# Patient Record
Sex: Female | Born: 1965
Health system: Southern US, Community
[De-identification: ages and names within clinical notes are randomized; demographics above are authoritative.]

## PROBLEM LIST (undated history)

## (undated) DIAGNOSIS — F32A Depression, unspecified: Secondary | ICD-10-CM

## (undated) DIAGNOSIS — E042 Nontoxic multinodular goiter: Secondary | ICD-10-CM

## (undated) DIAGNOSIS — H44811 Hemophthalmos, right eye: Secondary | ICD-10-CM

## (undated) DIAGNOSIS — I10 Essential (primary) hypertension: Secondary | ICD-10-CM

## (undated) DIAGNOSIS — F329 Major depressive disorder, single episode, unspecified: Secondary | ICD-10-CM

## (undated) DIAGNOSIS — F419 Anxiety disorder, unspecified: Secondary | ICD-10-CM

## (undated) DIAGNOSIS — E079 Disorder of thyroid, unspecified: Secondary | ICD-10-CM

## (undated) DIAGNOSIS — S42309A Unspecified fracture of shaft of humerus, unspecified arm, initial encounter for closed fracture: Secondary | ICD-10-CM

## (undated) DIAGNOSIS — M26609 Unspecified temporomandibular joint disorder, unspecified side: Secondary | ICD-10-CM

## (undated) DIAGNOSIS — F909 Attention-deficit hyperactivity disorder, unspecified type: Secondary | ICD-10-CM

## (undated) DIAGNOSIS — F988 Other specified behavioral and emotional disorders with onset usually occurring in childhood and adolescence: Secondary | ICD-10-CM

## (undated) DIAGNOSIS — K219 Gastro-esophageal reflux disease without esophagitis: Secondary | ICD-10-CM

## (undated) DIAGNOSIS — F341 Dysthymic disorder: Secondary | ICD-10-CM

## (undated) DIAGNOSIS — M797 Fibromyalgia: Secondary | ICD-10-CM

## (undated) HISTORY — DX: Disorder of thyroid, unspecified: E07.9

## (undated) HISTORY — DX: Anxiety disorder, unspecified: F41.9

## (undated) HISTORY — PX: LAPAROSCOPIC OVARIAN CYSTECTOMY: SUR786

## (undated) HISTORY — DX: Other specified behavioral and emotional disorders with onset usually occurring in childhood and adolescence: F98.8

## (undated) HISTORY — DX: Unspecified fracture of shaft of humerus, unspecified arm, initial encounter for closed fracture: S42.309A

## (undated) HISTORY — DX: Fibromyalgia: M79.7

## (undated) HISTORY — DX: Dysthymic disorder: F34.1

## (undated) HISTORY — DX: Gastro-esophageal reflux disease without esophagitis: K21.9

## (undated) HISTORY — DX: Essential (primary) hypertension: I10

## (undated) HISTORY — DX: Hemophthalmos, right eye: H44.811

## (undated) HISTORY — DX: Attention-deficit hyperactivity disorder, unspecified type: F90.9

## (undated) HISTORY — DX: Nontoxic multinodular goiter: E04.2

## (undated) HISTORY — PX: APPENDECTOMY: SHX54

## (undated) HISTORY — DX: Depression, unspecified: F32.A

## (undated) HISTORY — DX: Unspecified temporomandibular joint disorder, unspecified side: M26.609

---
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
## 2001-07-26 ENCOUNTER — Other Ambulatory Visit: Admission: RE | Admit: 2001-07-26 | Discharge: 2001-07-26 | Payer: Self-pay | Admitting: *Deleted

## 2006-03-20 ENCOUNTER — Encounter: Admission: RE | Admit: 2006-03-20 | Discharge: 2006-03-20 | Payer: Self-pay | Admitting: Family Medicine

## 2006-04-18 ENCOUNTER — Encounter: Admission: RE | Admit: 2006-04-18 | Discharge: 2006-04-18 | Payer: Self-pay | Admitting: Family Medicine

## 2006-09-01 ENCOUNTER — Ambulatory Visit (HOSPITAL_COMMUNITY): Admission: RE | Admit: 2006-09-01 | Discharge: 2006-09-01 | Payer: Self-pay | Admitting: Family Medicine

## 2007-03-22 ENCOUNTER — Encounter: Admission: RE | Admit: 2007-03-22 | Discharge: 2007-03-22 | Payer: Self-pay | Admitting: Family Medicine

## 2007-03-29 ENCOUNTER — Encounter: Admission: RE | Admit: 2007-03-29 | Discharge: 2007-03-29 | Payer: Self-pay | Admitting: Family Medicine

## 2008-05-15 ENCOUNTER — Encounter: Admission: RE | Admit: 2008-05-15 | Discharge: 2008-05-15 | Payer: Self-pay | Admitting: Family Medicine

## 2009-12-02 ENCOUNTER — Encounter: Admission: RE | Admit: 2009-12-02 | Discharge: 2009-12-02 | Payer: Self-pay | Admitting: Family Medicine

## 2010-07-20 ENCOUNTER — Encounter: Admission: RE | Admit: 2010-07-20 | Discharge: 2010-07-20 | Payer: Self-pay | Admitting: Family Medicine

## 2010-10-17 ENCOUNTER — Encounter: Payer: Self-pay | Admitting: Family Medicine

## 2010-12-18 IMAGING — US US SOFT TISSUE HEAD/NECK
1 series · 14 of 25 positions shown · non-contrast
Comparison: None

CLINICAL DATA: Neck swelling

THYROID ULTRASOUND
TECHNIQUE: Ultrasound examination of the thyroid gland and adjacent
soft tissues was performed.

[Series 1: us soft tissue head/neck · 0.04mm/px · 14 of 47 slices shown]
[im 1/47]
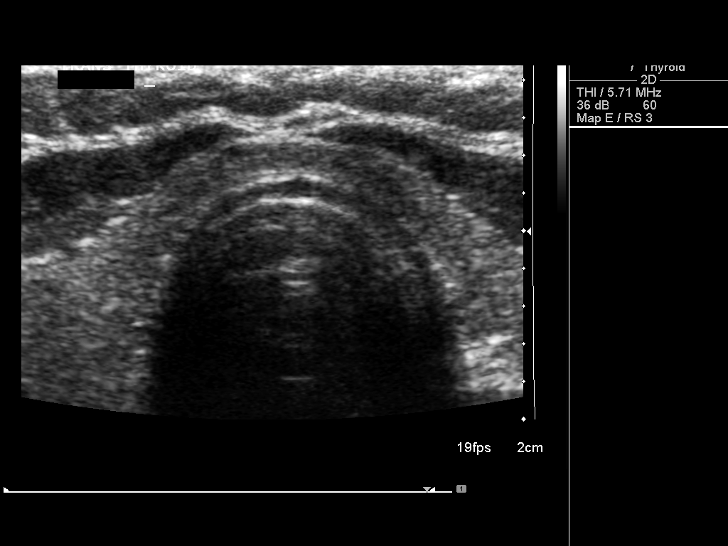
[im 4/47]
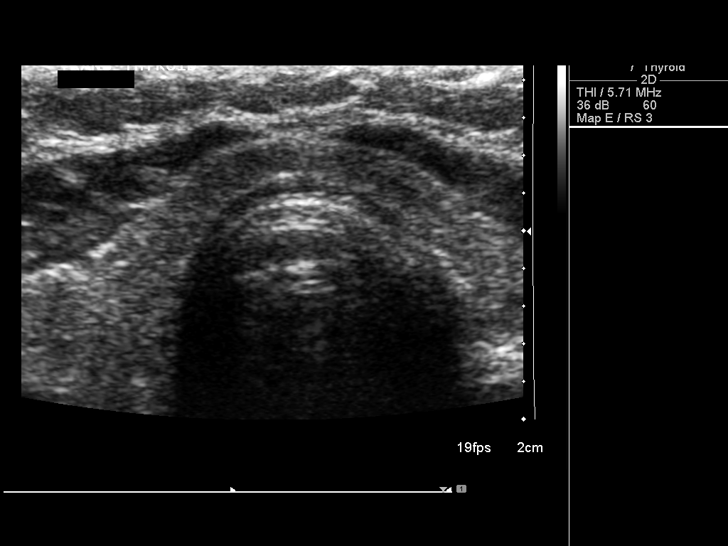
[im 8/47]
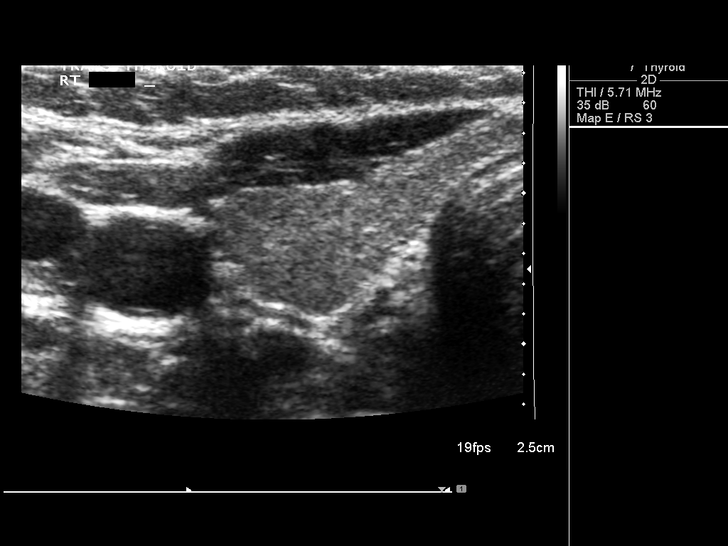
[im 12/47]
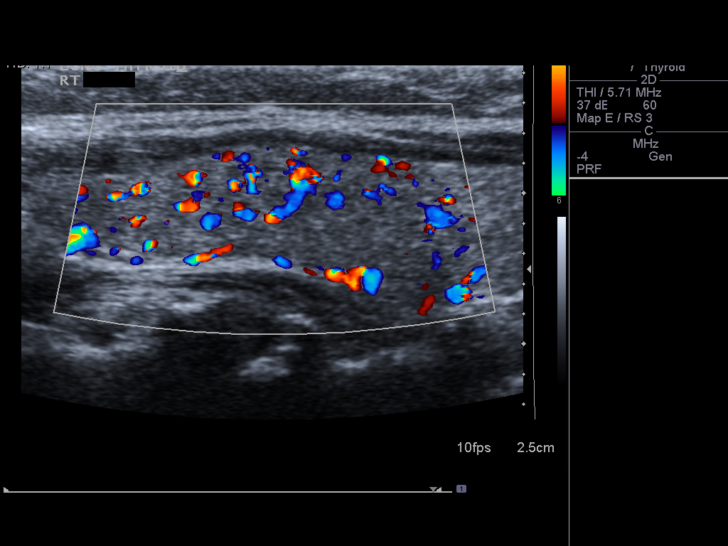
[im 16/47]
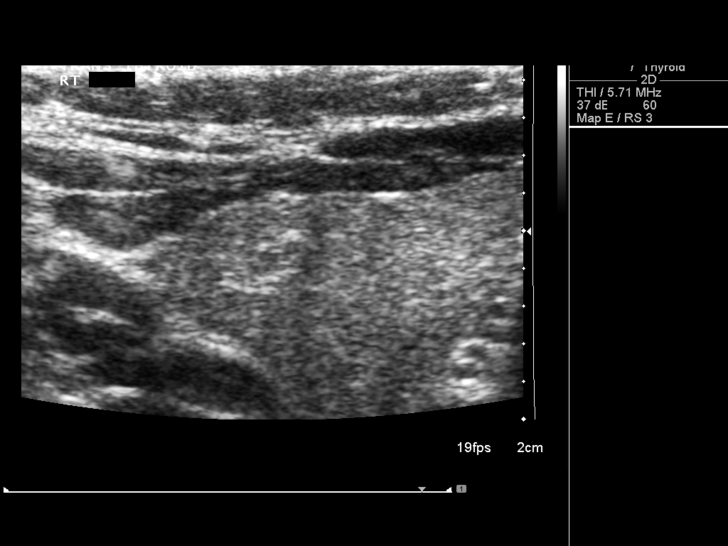
[im 18/47]
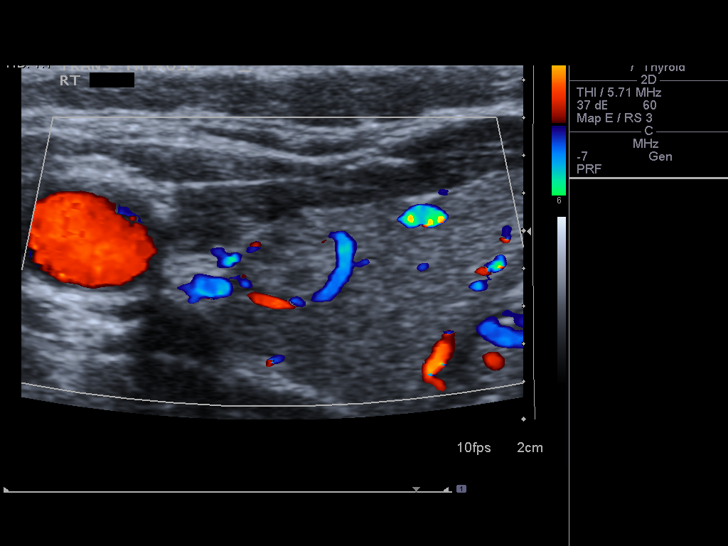
[im 22/47]
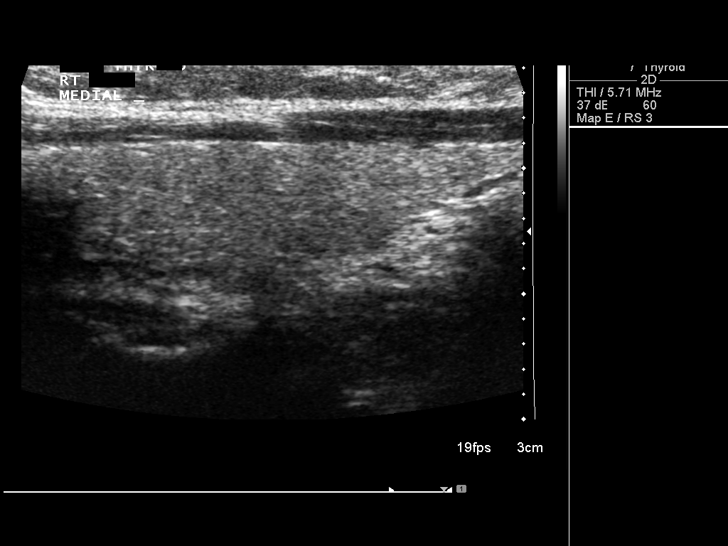
[im 25/47]
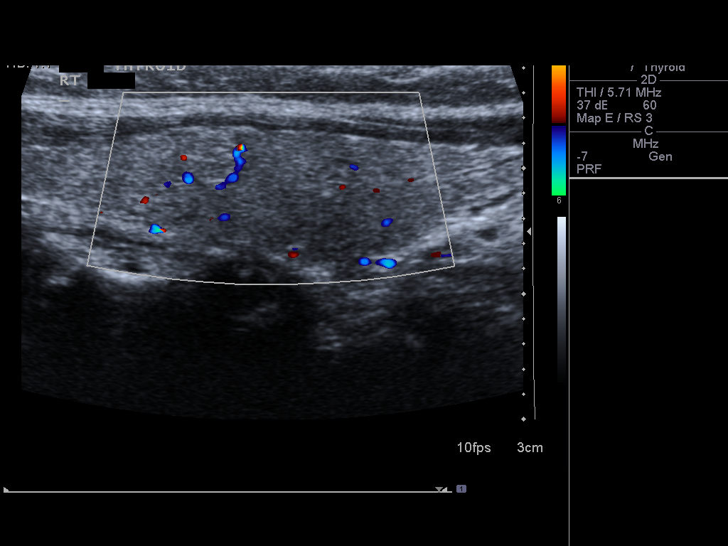
[im 29/47]
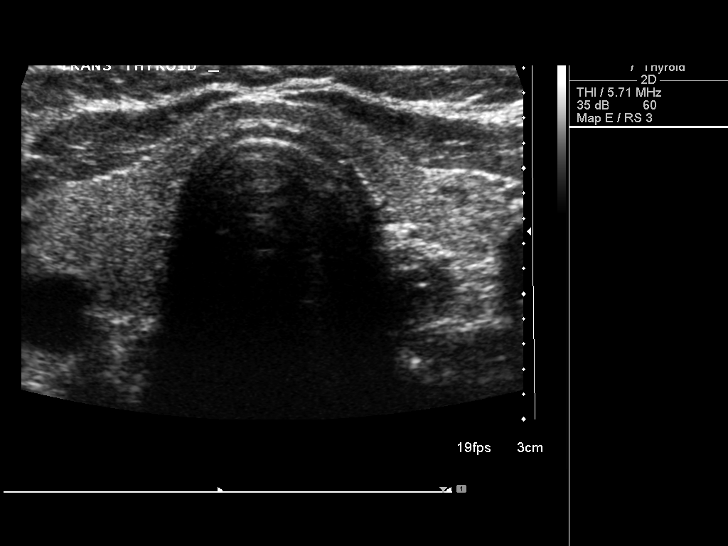
[im 31/47]
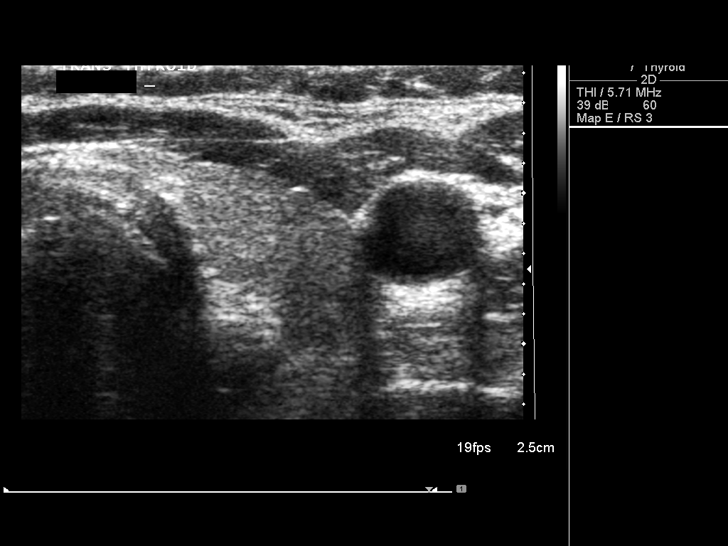
[im 35/47]
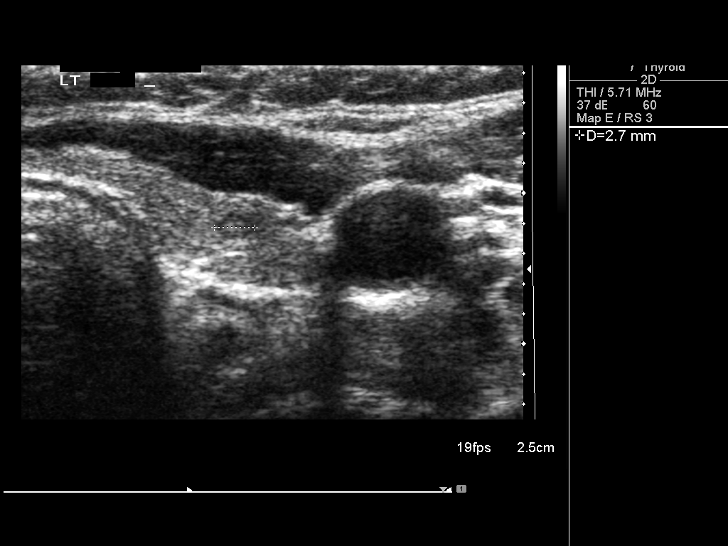
[im 39/47]
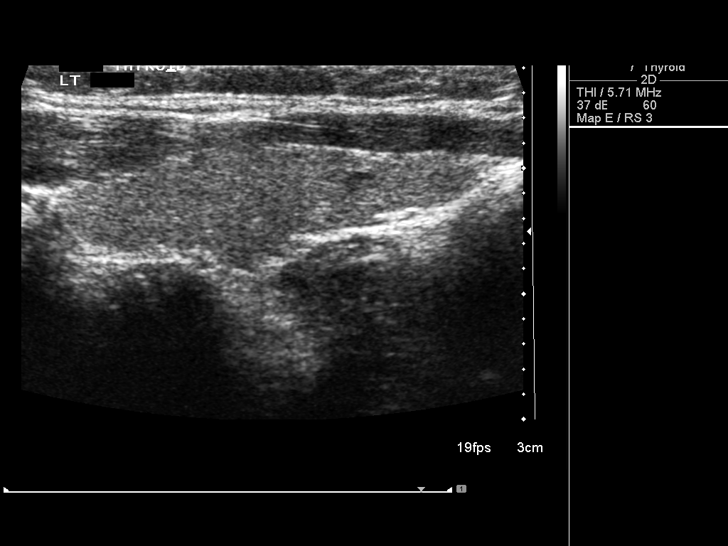
[im 43/47]
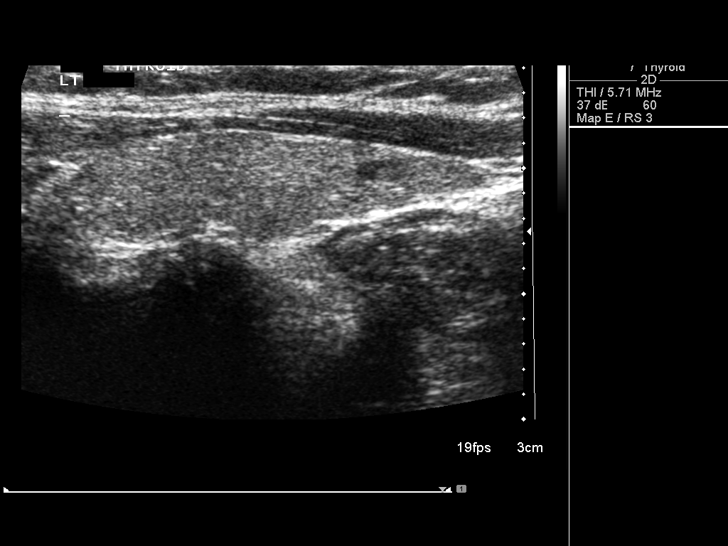
[im 47/47]
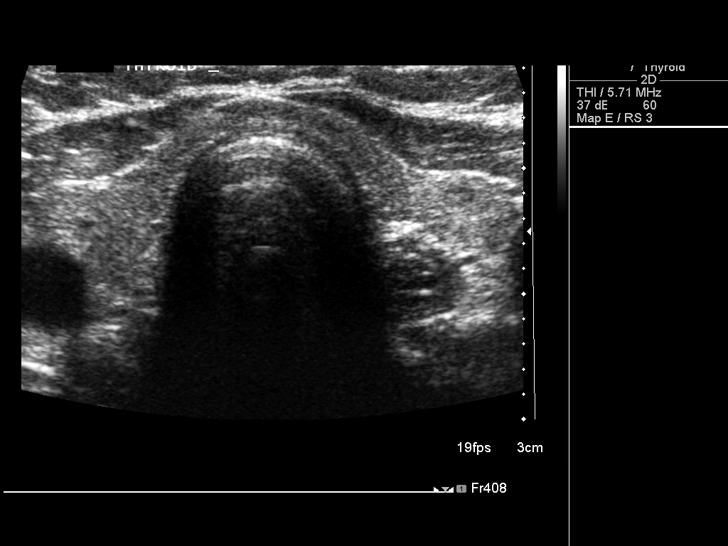

[14 of 25 positions shown; findings below may reference images not displayed]

FINDINGS: Right thyroid lobe:  11 x 17 x 44 mm, homogeneous in echotexture
with a single nodule
Left thyroid lobe:  9 x 13 x 35 mm, homogeneous in echotexture with
a single nodule
Isthmus:  2.1 mm, unremarkable

Focal nodules:
Poorly marginated 5 mm nearly isoechoic solid, superior pole right
4 mm hypoechoic solid, inferior pole left

Lymphadenopathy:  None visualized.
IMPRESSION: 1.  There are 2 small thyroid nodules.  No dominant mass or nodule.
Continued surveillance recommended. This recommendation follows the
consensus statement:  Management of Thyroid Nodules Detected as US:
Society of Radiologists in Ultrasound Consensus Conference
[URL]

## 2010-12-29 ENCOUNTER — Other Ambulatory Visit: Payer: Self-pay | Admitting: Internal Medicine

## 2010-12-29 DIAGNOSIS — E041 Nontoxic single thyroid nodule: Secondary | ICD-10-CM

## 2011-04-04 ENCOUNTER — Ambulatory Visit
Admission: RE | Admit: 2011-04-04 | Discharge: 2011-04-04 | Disposition: A | Discharge status: Home / Self Care | Payer: BC Managed Care – PPO | Source: Ambulatory Visit | Attending: Internal Medicine | Admitting: Internal Medicine

## 2011-04-04 DIAGNOSIS — E041 Nontoxic single thyroid nodule: Secondary | ICD-10-CM

## 2011-06-30 ENCOUNTER — Other Ambulatory Visit: Payer: Self-pay | Admitting: Internal Medicine

## 2011-06-30 DIAGNOSIS — E041 Nontoxic single thyroid nodule: Secondary | ICD-10-CM

## 2011-07-26 ENCOUNTER — Ambulatory Visit
Admission: RE | Admit: 2011-07-26 | Discharge: 2011-07-26 | Disposition: A | Discharge status: Home / Self Care | Payer: BC Managed Care – PPO | Source: Ambulatory Visit | Attending: Internal Medicine | Admitting: Internal Medicine

## 2011-07-26 DIAGNOSIS — E041 Nontoxic single thyroid nodule: Secondary | ICD-10-CM

## 2011-08-25 ENCOUNTER — Other Ambulatory Visit: Payer: Self-pay | Admitting: Internal Medicine

## 2011-08-25 DIAGNOSIS — Z1231 Encounter for screening mammogram for malignant neoplasm of breast: Secondary | ICD-10-CM

## 2011-09-02 IMAGING — US US SOFT TISSUE HEAD/NECK
1 series · 14 of 25 positions shown · non-contrast
Comparison: Ultrasound of the thyroid of 07/20/2010

CLINICAL DATA: Follow up of thyroid nodules, on Synthroid

THYROID ULTRASOUND
TECHNIQUE: Ultrasound examination of the thyroid gland and adjacent
soft tissues was performed.

[Series 1: us soft tissue head/neck · 0.07mm/px · 14 of 42 slices shown]
[im 1/42]
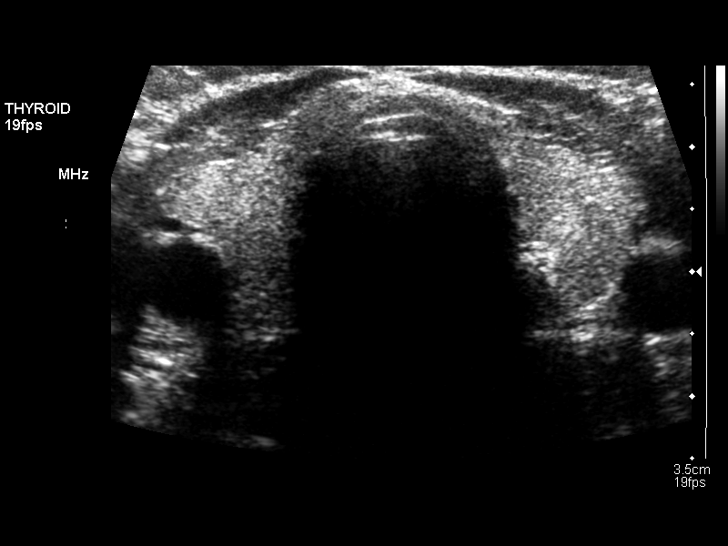
[im 4/42]
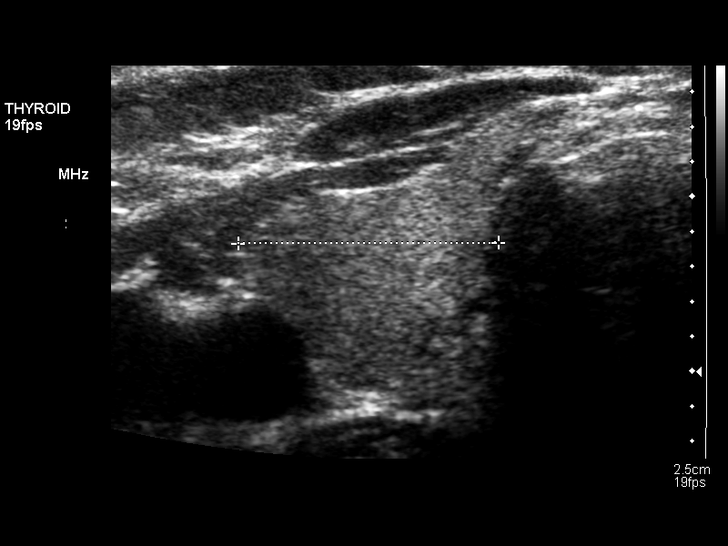
[im 7/42]
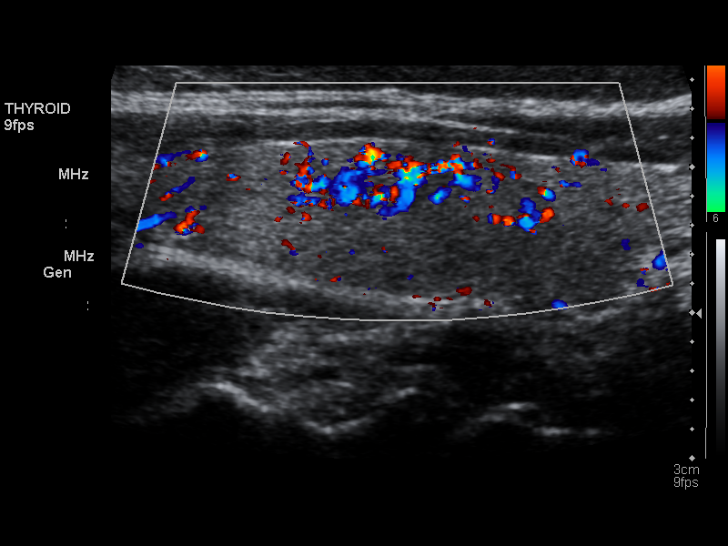
[im 11/42]
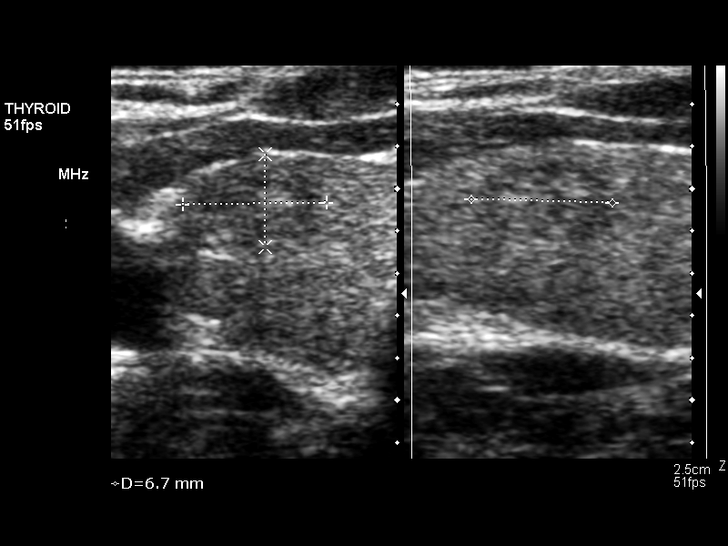
[im 14/42]
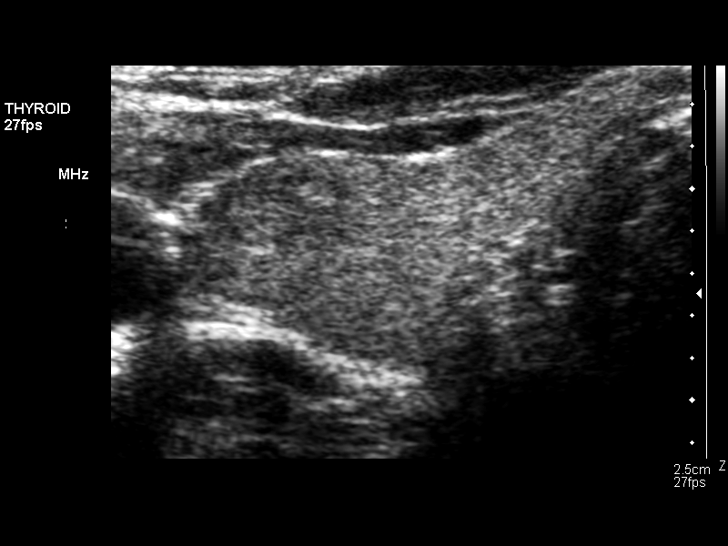
[im 16/42]
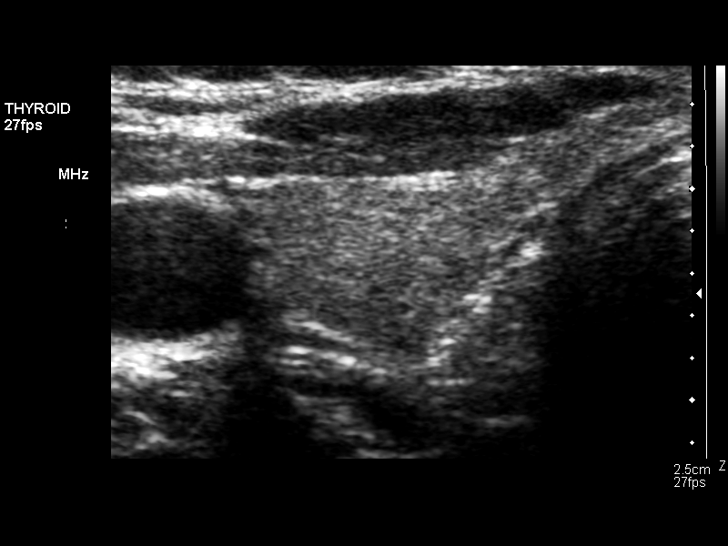
[im 19/42]
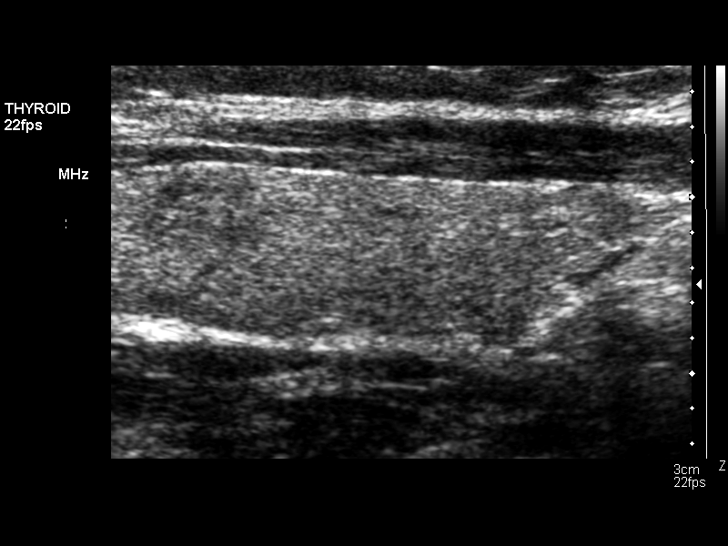
[im 23/42]
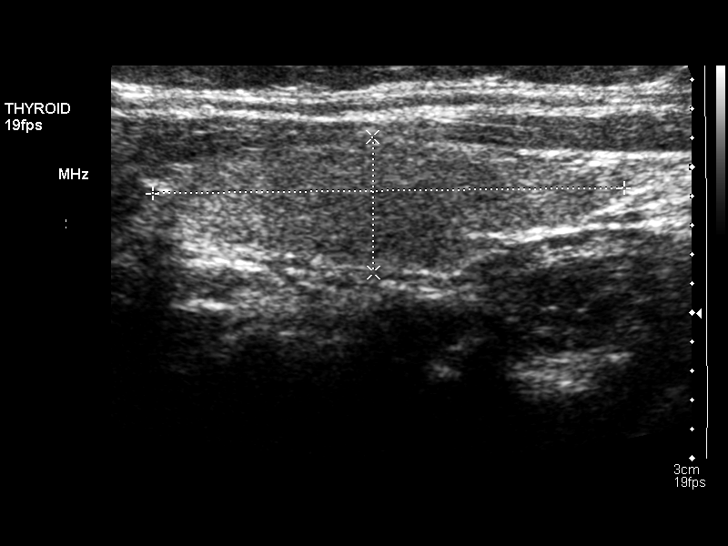
[im 26/42]
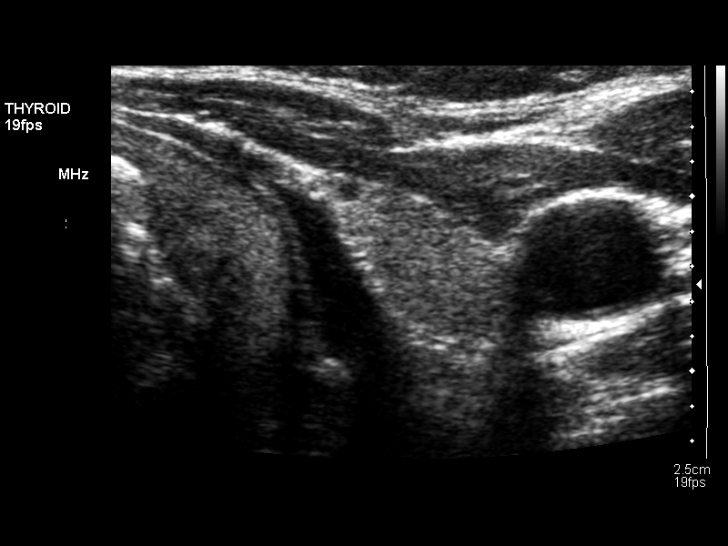
[im 28/42]
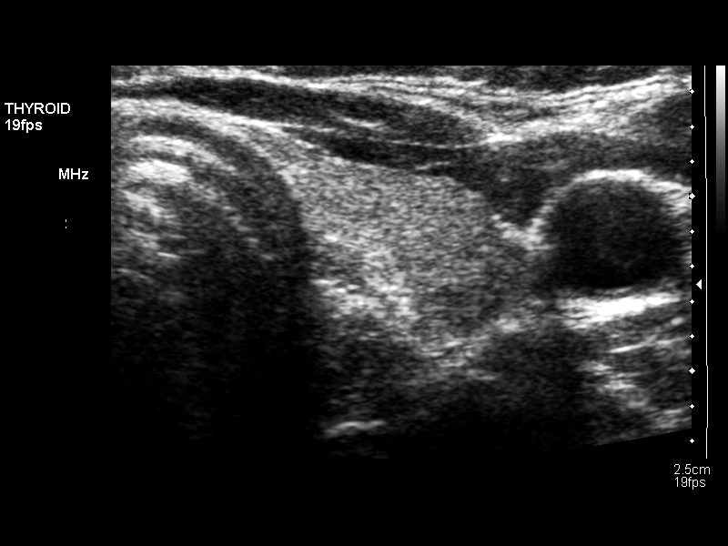
[im 31/42]
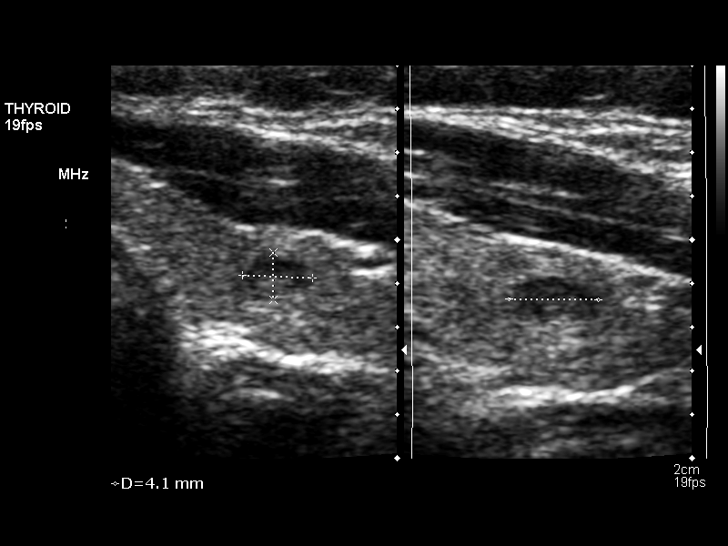
[im 35/42]
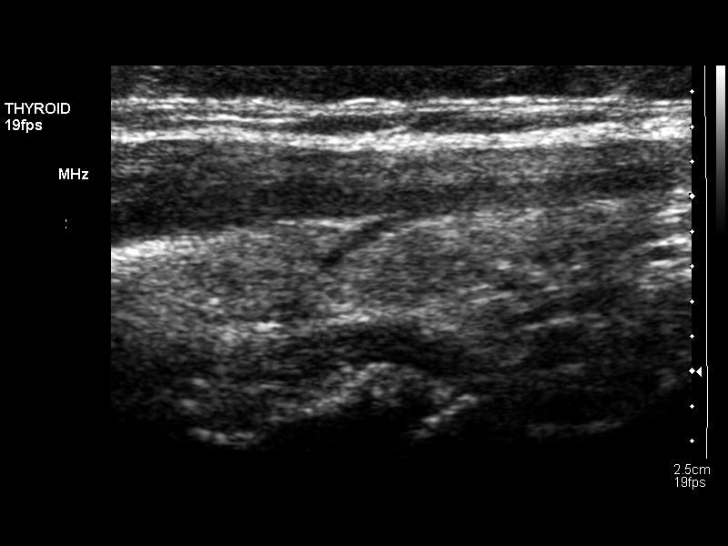
[im 38/42]
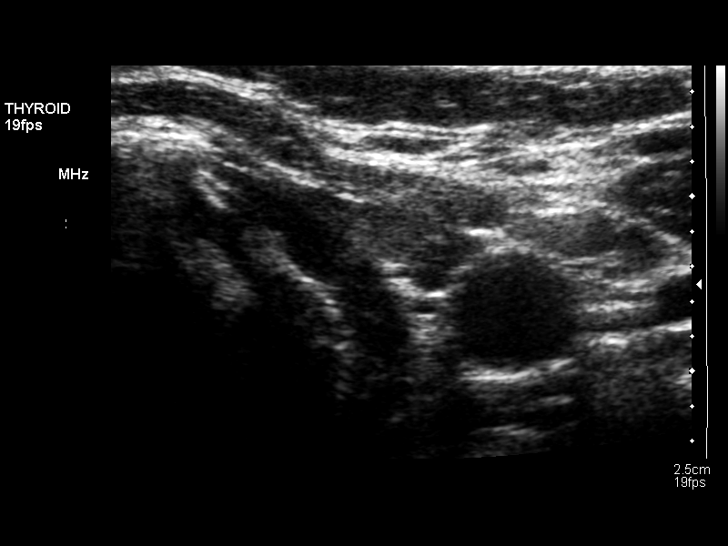
[im 42/42]
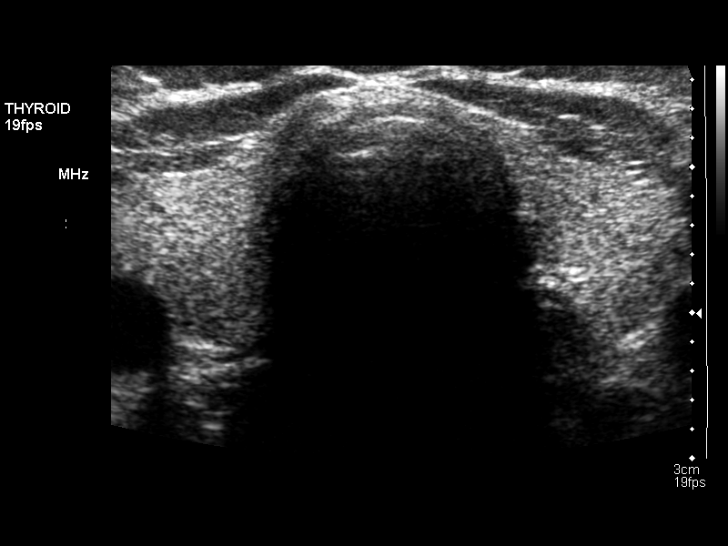

[14 of 25 positions shown; findings below may reference images not displayed]

FINDINGS: Right thyroid lobe:  4.2 x 1.8 x 1.5 cm.  (Previously 4.4 x 1.1 x
1.7 cm).
Left thyroid lobe:  3.2 x 0.9 x 1.2 cm.  (Previously 3.5 x 0.9 x
1.3 cm).
Isthmus:  2 mm in thickness.

Focal nodules:  The thyroid gland is fairly homogeneous in
echogenicity.  Only small solid nodules are noted bilaterally, with
the larger nodule on the right measuring 7 x 4 x 7 mm.

Lymphadenopathy:  None visualized.
IMPRESSION: The thyroid gland is normal in size with only small nodules of no
larger than 7 mm in diameter.

## 2011-09-21 ENCOUNTER — Ambulatory Visit
Admission: RE | Admit: 2011-09-21 | Discharge: 2011-09-21 | Disposition: A | Discharge status: Home / Self Care | Payer: BC Managed Care – PPO | Source: Ambulatory Visit | Attending: Internal Medicine | Admitting: Internal Medicine

## 2011-09-21 DIAGNOSIS — Z1231 Encounter for screening mammogram for malignant neoplasm of breast: Secondary | ICD-10-CM

## 2011-12-24 IMAGING — US US SOFT TISSUE HEAD/NECK
1 series · 14 of 25 positions shown · non-contrast
Comparison: Thyroid ultrasound 07/20/2010 and 04/04/2011.

CLINICAL DATA: Follow-up thyroid nodules. Thyroid cyst.

THYROID ULTRASOUND
TECHNIQUE: Ultrasound examination of the thyroid gland and adjacent
soft tissues was performed.

[Series 1: us soft tissue head/neck · 0.07mm/px · 14 of 45 slices shown]
[im 1/45]
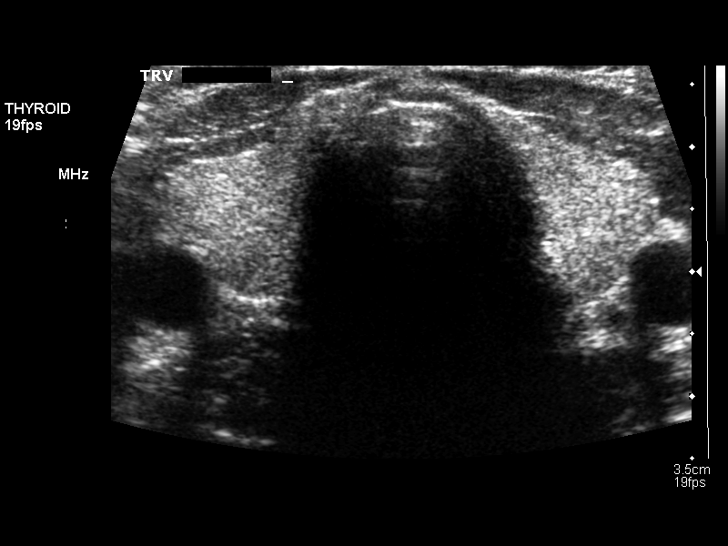
[im 4/45]
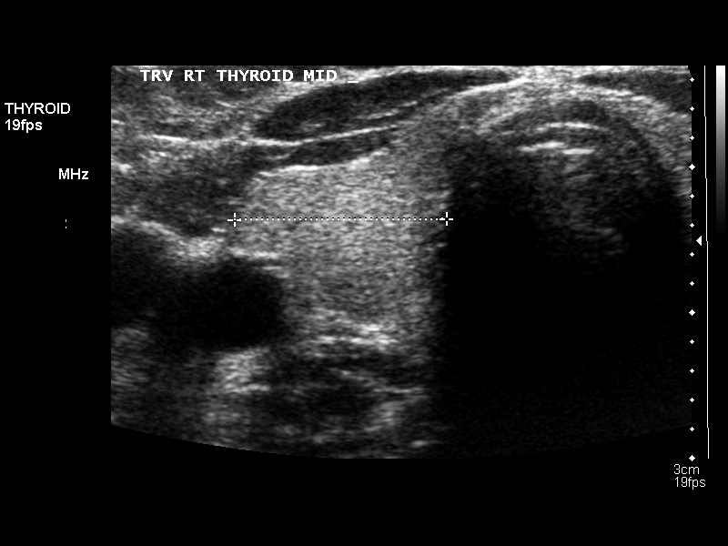
[im 8/45]
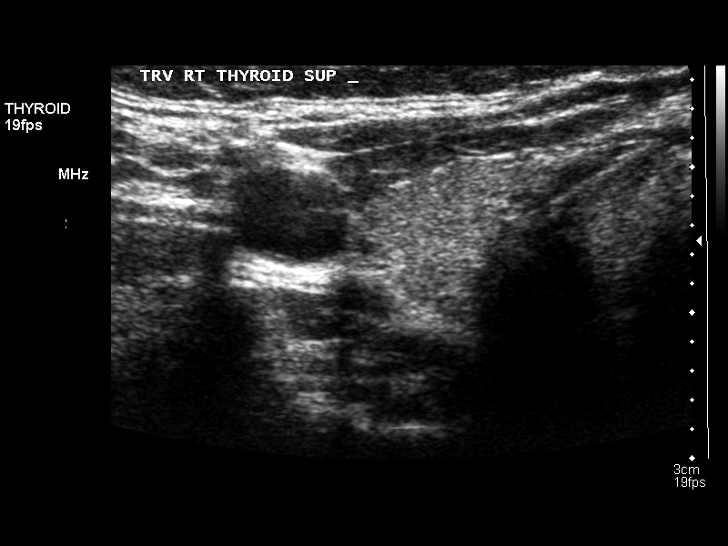
[im 12/45]
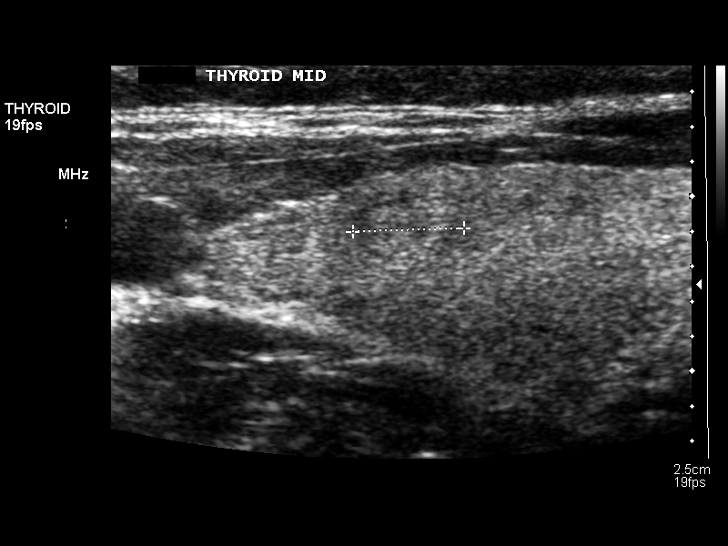
[im 15/45]
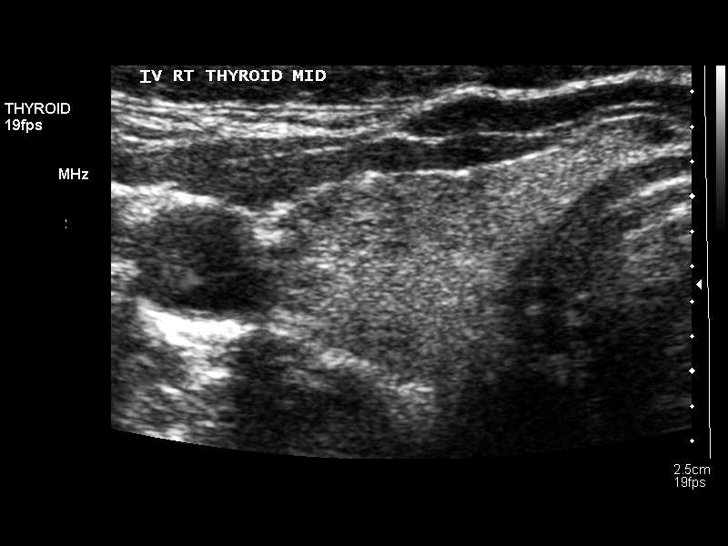
[im 17/45]
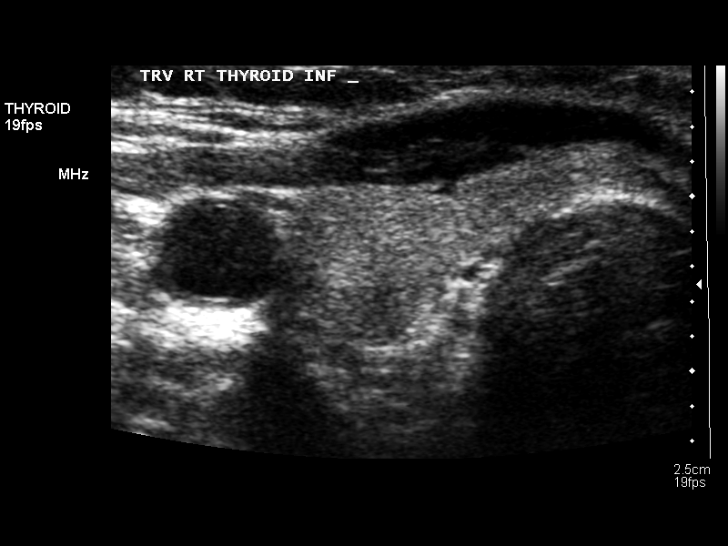
[im 21/45]
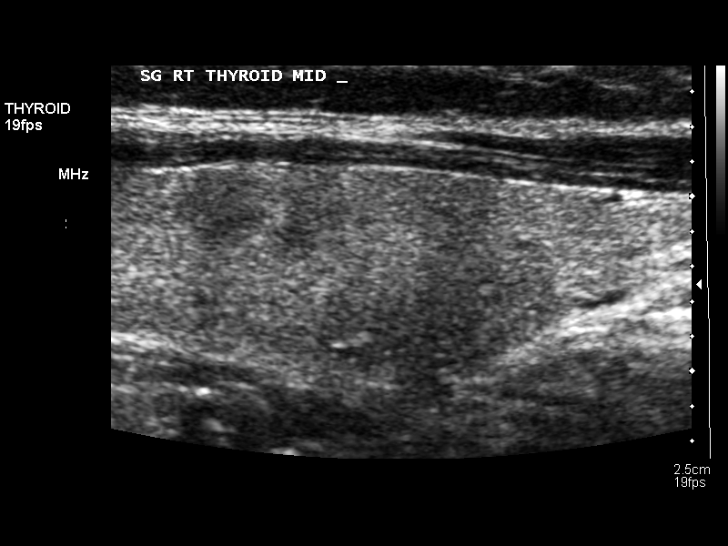
[im 24/45]
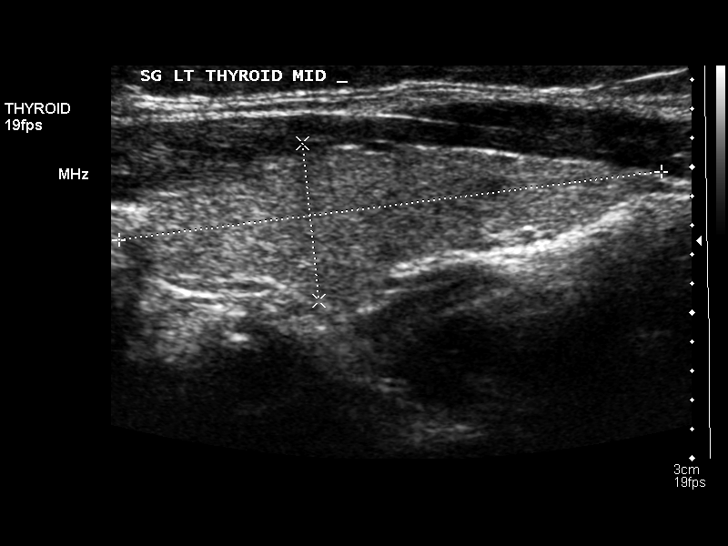
[im 28/45]
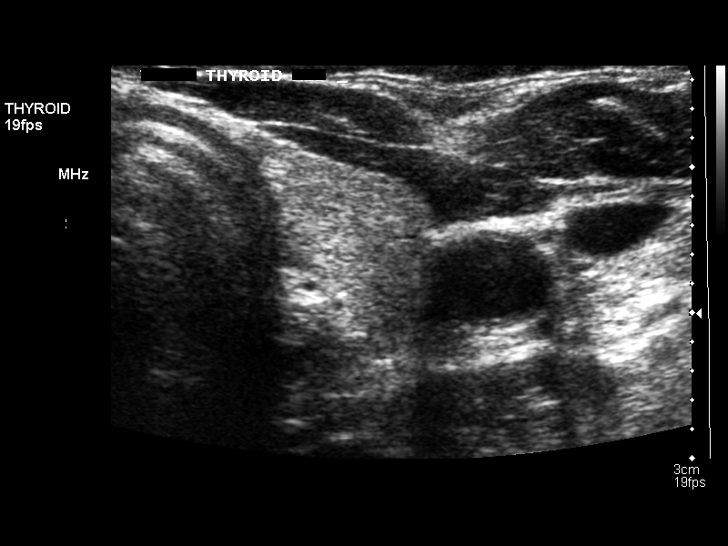
[im 30/45]
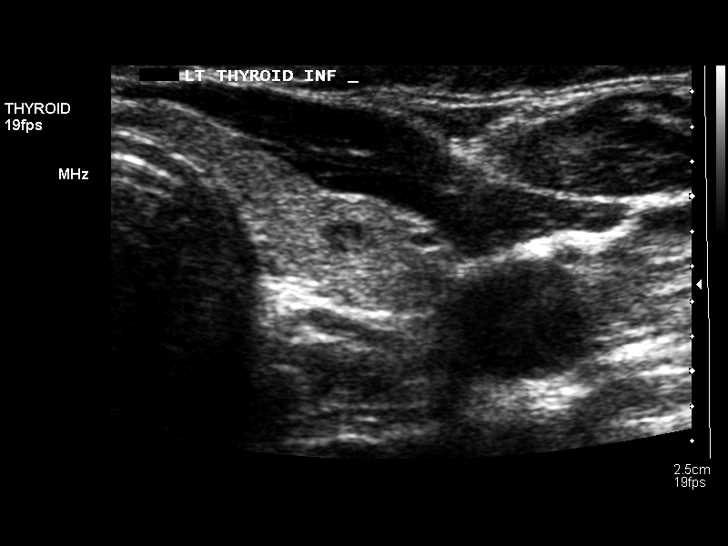
[im 34/45]
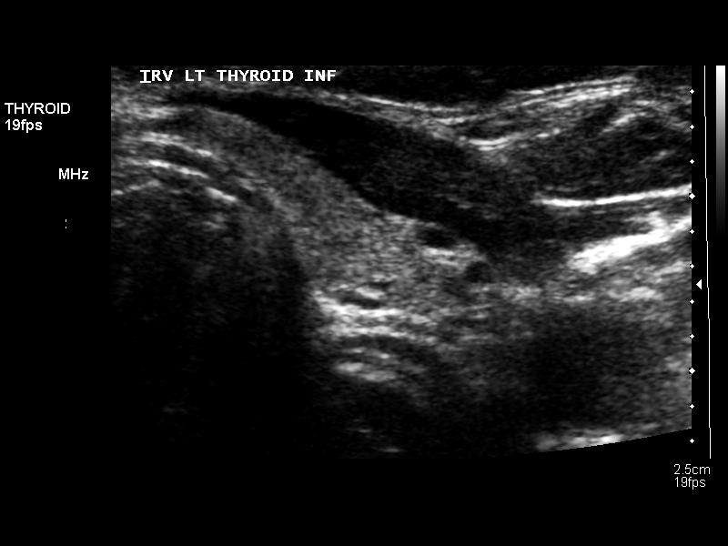
[im 37/45]
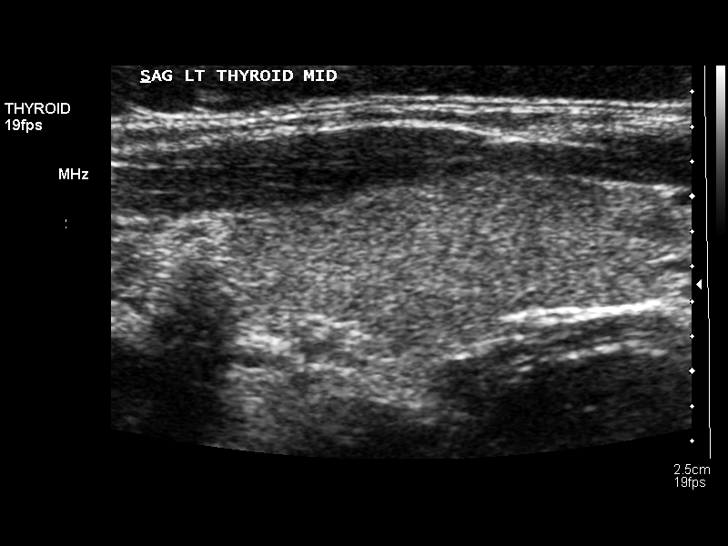
[im 41/45]
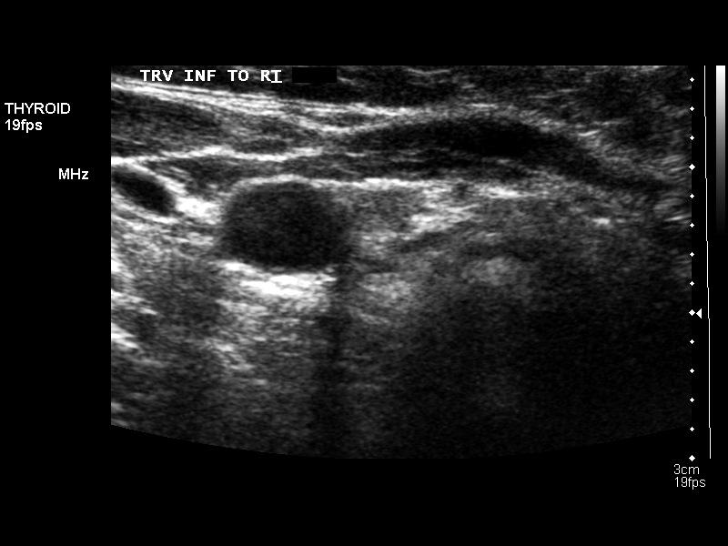
[im 45/45]
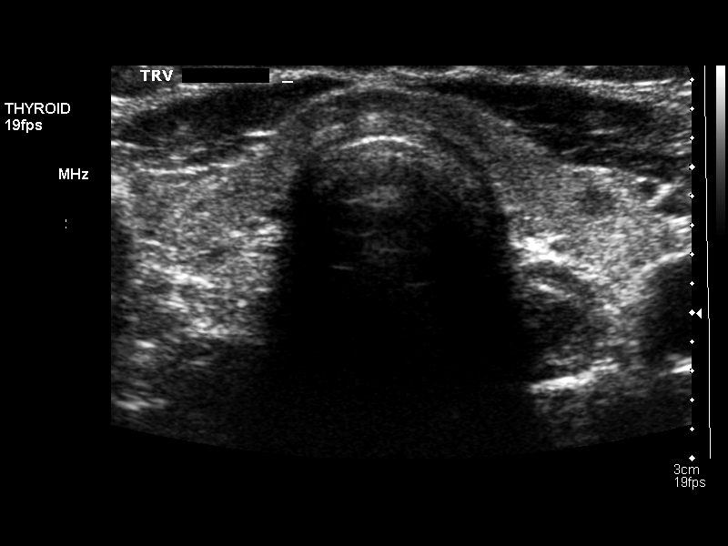

[14 of 25 positions shown; findings below may reference images not displayed]

FINDINGS: Right thyroid lobe:  4.6 x 1.4 x 1.5 cm.
Left thyroid lobe:  3.8 x 1.1 x 1.2 cm.
Isthmus:  2.4 mm in thickness.

Focal nodules:  Small thyroid nodules are demonstrated bilaterally.
Peripherally in the mid right lobe is a nearly isoechoic solid
nodule measuring 6 x 5 x 6 mm.  This previously measured 7 x 4 x 7
mm.  Inferiorly in the left lobe, there is a hypoechoic nodule
measuring 3 x 3 x 5 mm.  This previously measured 3 x 2 x 4 mm.  No
dominant nodule is identified.

Lymphadenopathy:  None visualized.
IMPRESSION: No significant change in small thyroid nodules bilaterally.  No
suspicious findings are demonstrated.

## 2013-10-22 ENCOUNTER — Encounter: Payer: Self-pay | Admitting: Certified Nurse Midwife

## 2013-10-23 ENCOUNTER — Ambulatory Visit: Payer: Self-pay | Admitting: Certified Nurse Midwife

## 2013-10-24 ENCOUNTER — Other Ambulatory Visit: Payer: Self-pay | Admitting: Certified Nurse Midwife

## 2013-10-24 NOTE — Telephone Encounter (Signed)
Last AEX and refill 10/17/2012 1 year refills. Next appt 10/30/2013  Will refill for 1 month until appt.

## 2013-10-30 ENCOUNTER — Ambulatory Visit (INDEPENDENT_AMBULATORY_CARE_PROVIDER_SITE_OTHER): Payer: 59 | Admitting: Certified Nurse Midwife

## 2013-10-30 ENCOUNTER — Encounter: Payer: Self-pay | Admitting: Certified Nurse Midwife

## 2013-10-30 VITALS — BP 110/68 | HR 64 | Resp 16 | Ht 66.0 in | Wt 194.0 lb

## 2013-10-30 DIAGNOSIS — Z0000 Encounter for general adult medical examination without abnormal findings: Secondary | ICD-10-CM

## 2013-10-30 DIAGNOSIS — Z309 Encounter for contraceptive management, unspecified: Secondary | ICD-10-CM

## 2013-10-30 DIAGNOSIS — Z01419 Encounter for gynecological examination (general) (routine) without abnormal findings: Secondary | ICD-10-CM

## 2013-10-30 LAB — POCT URINALYSIS DIPSTICK
BILIRUBIN UA: NEGATIVE
Glucose, UA: NEGATIVE
KETONES UA: NEGATIVE
Leukocytes, UA: NEGATIVE
NITRITE UA: NEGATIVE
PH UA: 5
Protein, UA: NEGATIVE
RBC UA: NEGATIVE
Urobilinogen, UA: NEGATIVE

## 2013-10-30 MED ORDER — NORETHINDRONE 0.35 MG PO TABS: ORAL_TABLET | ORAL | Status: DC

## 2013-10-30 NOTE — Patient Instructions (Signed)

## 2013-10-30 NOTE — Progress Notes (Addendum)
48 y.o. G1P0010 Single Caucasian Fe here for annual exam. Periods scant to none, but did have a normal period recently. Working on weight loss, down 21 pounds. Sees PCP yearly for aex,labs and medication for anxiety, changed to Lexapro. Patient off Thyroid medication now.  No health issues today.  Patient's last menstrual period was 10/16/2013.          Sexually active: no  The current method of family planning is oral progesterone-only contraceptive.    Exercising: yes  zumba Smoker:  no  Health Maintenance: Pap:  10-17-12 neg HPV HR neg MMG: 1/14 & rt breast u/s complicated cyst follow up 1/15  Colonoscopy: 2006 neg. Repeat at 48 due this year. BMD:   2002 TDaP: 2011 Labs: Poct urine-neg Self breast exam: done occ   reports that she has never smoked. She does not have any smokeless tobacco history on file. She reports that she drinks about 3.0 ounces of alcohol per week. She reports that she uses illicit drugs.  Past Medical History  Diagnosis Date  . Thyroid disease     hypothyroid  . Anxiety   . Arm fracture     Past Surgical History  Procedure Laterality Date  . Appendectomy    . Laparoscopic ovarian cystectomy      Current Outpatient Prescriptions  Medication Sig Dispense Refill  . ALPRAZolam (XANAX) 0.5 MG tablet as needed.      . Ascorbic Acid (VITAMIN C PO) Take by mouth daily.      . cefUROXime (CEFTIN) 250 MG tablet 2 (two) times daily.      . Cholecalciferol (VITAMIN D PO) Take 5,000 Int'l Units by mouth daily.      . cyclobenzaprine (FLEXERIL) 10 MG tablet as needed.      Marland Kitchen. escitalopram (LEXAPRO) 10 MG tablet daily.      . ORTHO MICRONOR 0.35 MG tablet take 1 tablet by mouth once daily  28 tablet  0   No current facility-administered medications for this visit.    Family History  Problem Relation Age of Onset  . Thyroid disease Mother   . Depression Mother   . Stroke Mother     ministroke  . Cancer Father     colon  . Depression Sister   . Cancer  Paternal Uncle     melanoma  . Heart attack Paternal Grandfather     ROS:  Pertinent items are noted in HPI.  Otherwise, a comprehensive ROS was negative.  Exam:   BP 110/68  Pulse 64  Resp 16  Ht 5\' 6"  (1.676 m)  Wt 194 lb (87.998 kg)  BMI 31.33 kg/m2  LMP 10/16/2013 Height: 5\' 6"  (167.6 cm)  Ht Readings from Last 3 Encounters:  10/30/13 5\' 6"  (1.676 m)    General appearance: alert, cooperative and appears stated age Head: Normocephalic, without obvious abnormality, atraumatic Neck: no adenopathy, supple, symmetrical, trachea midline and thyroid normal to inspection and palpation Lungs: clear to auscultation bilaterally Breasts: normal appearance, no masses or tenderness, No nipple retraction or dimpling, No nipple discharge or bleeding, No axillary or supraclavicular adenopathy Heart: regular rate and rhythm Abdomen: soft, non-tender; no masses,  no organomegaly Extremities: extremities normal, atraumatic, no cyanosis or edema Skin: Skin color, texture, turgor normal. No rashes or lesions Lymph nodes: Cervical, supraclavicular, and axillary nodes normal. No abnormal inguinal nodes palpated Neurologic: Grossly normal   Pelvic: External genitalia:  no lesions              Urethra:  normal appearing urethra with no masses, tenderness or lesions              Bartholin's and Skene's: normal                 Vagina: normal appearing vagina with normal color and discharge, no lesions              Cervix: normal, non tender              Pap taken: no Bimanual Exam:  Uterus:  normal size, contour, position, consistency, mobility, non-tender and anteverted              Adnexa: normal adnexa and no mass, fullness, tenderness               Rectovaginal: Confirms               Anus:  normal sphincter tone, no lesions  A:  Well Woman with normal exam  Contraception working well POP  Hypothyroid no medication now  Anxiety on stable medication with PCP management  P:   Reviewed  health and wellness pertinent to exam  Rx Micronor see order  Continue follow up as indicated  Pap smear as per guidelines   Mammogram follow up as indicated was due in 1/15 and yearly pap smear not taken today  counseled on breast self exam, mammography screening, STD prevention, HIV risk factors and prevention, adequate intake of calcium and vitamin D, diet and exercise  return annually or prn  An After Visit Summary was printed and given to the patient.

## 2013-11-01 NOTE — Progress Notes (Signed)
Reviewed personally.  M. Suzanne Devine Dant, MD.  

## 2013-12-09 ENCOUNTER — Telehealth: Payer: Self-pay | Admitting: Emergency Medicine

## 2013-12-09 NOTE — Telephone Encounter (Signed)
Outgoing call to patient. She is in 09/2013 Recall for [facility] Mammograms. Needs Bilateral Dx Mammogram.   Scheduled at [facility] [address] location for 12/11/13 at 1:00.   Message left to return call to [employer] at [phone] to see if she can make that appointment or need r/s.   Paper copy to Verner Choleborah S. Leonard CNM for order.

## 2013-12-11 NOTE — Telephone Encounter (Signed)
Message left to return call to [employer] at [phone].   Unsure if patient made appointment. Attempting to r/s if not.

## 2013-12-13 NOTE — Telephone Encounter (Signed)
[employer]. Patient has an appointment schedule for 3/24.

## 2013-12-26 NOTE — Telephone Encounter (Signed)
Hard copy Mammogram signed by Verner Choleborah S. Leonard CNM.   Will have scanned.   Routing to provider for final review. Patient agreeable to disposition. Will close encounter

## 2014-07-28 ENCOUNTER — Encounter: Payer: Self-pay | Admitting: Certified Nurse Midwife

## 2014-11-12 ENCOUNTER — Encounter: Payer: Self-pay | Admitting: Certified Nurse Midwife

## 2014-11-12 ENCOUNTER — Ambulatory Visit (INDEPENDENT_AMBULATORY_CARE_PROVIDER_SITE_OTHER): Payer: 59 | Admitting: Certified Nurse Midwife

## 2014-11-12 VITALS — BP 110/70 | HR 72 | Resp 16 | Ht 66.25 in | Wt 208.0 lb

## 2014-11-12 DIAGNOSIS — Z3041 Encounter for surveillance of contraceptive pills: Secondary | ICD-10-CM

## 2014-11-12 DIAGNOSIS — Z124 Encounter for screening for malignant neoplasm of cervix: Secondary | ICD-10-CM

## 2014-11-12 DIAGNOSIS — Z01419 Encounter for gynecological examination (general) (routine) without abnormal findings: Secondary | ICD-10-CM

## 2014-11-12 MED ORDER — NORETHINDRONE 0.35 MG PO TABS: ORAL_TABLET | ORAL | Status: DC

## 2014-11-12 NOTE — Patient Instructions (Signed)

## 2014-11-12 NOTE — Progress Notes (Signed)
49 y.o. [id number] Divorced  Caucasian Fe here for annual exam. Periods none with POP use. Happy with choice. Sees Dr.Pang with aex, labs. No health issues today.    No LMP recorded. Patient is not currently having periods (Reason: Oral contraceptives).    LMP 1/14      Sexually active: No.  The current method of family planning is oral progesterone-only contraceptive.    Exercising: Yes.    zumba Smoker:  no  Health Maintenance: Pap: 10-17-12 neg HPV HR neg MMG: 12-17-13 density c, birads 2:neg needs 3 D yearly Colonoscopy:  2006 neg, scheduled for 2wks from now BMD:   2002 TDaP:  2011 Labs: pcp Self breast exam: done occ   reports that she has never smoked. She does not have any smokeless tobacco history on file. She reports that she drinks about 4.8 oz of alcohol per week. She reports that she does not use illicit drugs.  Past Medical History  Diagnosis Date  . Thyroid disease     hypothyroid  . Anxiety   . Arm fracture     Past Surgical History  Procedure Laterality Date  . Appendectomy    . Laparoscopic ovarian cystectomy      Current Outpatient Prescriptions  Medication Sig Dispense Refill  . ALPRAZolam (XANAX) 0.5 MG tablet as needed.    . Ascorbic Acid (VITAMIN C PO) Take by mouth daily.    . Cholecalciferol (VITAMIN D PO) Take 5,000 Int'l Units by mouth daily.    . cyclobenzaprine (FLEXERIL) 10 MG tablet as needed.    Marland Kitchen. escitalopram (LEXAPRO) 10 MG tablet daily.    . norethindrone (ORTHO MICRONOR) 0.35 MG tablet take 1 tablet by mouth once daily 28 tablet 12   No current facility-administered medications for this visit.    Family History  Problem Relation Age of Onset  . Thyroid disease Mother   . Depression Mother   . Stroke Mother     ministroke  . Cancer Father     colon  . Depression Sister   . Cancer Paternal Uncle     melanoma  . Heart attack Paternal Grandfather     ROS:  Pertinent items are noted in HPI.  Otherwise, a comprehensive ROS was  negative.  Exam:   BP 110/70 mmHg  Pulse 72  Resp 16  Ht 5' 6.25" (1.683 m)  Wt 208 lb (94.348 kg)  BMI 33.31 kg/m2 Height: 5' 6.25" (168.3 cm) Ht Readings from Last 3 Encounters:  11/12/14 5' 6.25" (1.683 m)  10/30/13 5\' 6"  (1.676 m)    General appearance: alert, cooperative and appears stated age Head: Normocephalic, without obvious abnormality, atraumatic Neck: no adenopathy, supple, symmetrical, trachea midline and thyroid normal to inspection and palpation Lungs: clear to auscultation bilaterally Breasts: normal appearance, no masses or tenderness, No nipple retraction or dimpling, No nipple discharge or bleeding, No axillary or supraclavicular adenopathy Heart: regular rate and rhythm Abdomen: soft, non-tender; no masses,  no organomegaly Extremities: extremities normal, atraumatic, no cyanosis or edema Skin: Skin color, texture, turgor normal. No rashes or lesions Lymph nodes: Cervical, supraclavicular, and axillary nodes normal. No abnormal inguinal nodes palpated Neurologic: Grossly normal   Pelvic: External genitalia:  no lesions              Urethra:  normal appearing urethra with no masses, tenderness or lesions              Bartholin's and Skene's: normal  Vagina: normal appearing vagina with normal color and discharge, no lesions              Cervix: normal, non tender, no lesion              Pap taken: Yes.   Bimanual Exam:  Uterus:  normal size, contour, position, consistency, mobility, non-tender and anteverted              Adnexa: normal adnexa and no mass, fullness, tenderness               Rectovaginal: Confirms               Anus:  normal sphincter tone, no lesions  Chaperone present: Yes  A:  Well Woman with normal exam  Contraception/ cycle control of amenorrhea  Anxiety with PCP management of medication    P:   Reviewed health and wellness pertinent to exam  Rx Micronor see order  Pap smear taken today with HPV  reflex   counseled on breast self exam, mammography screening, adequate intake of calcium and vitamin D, diet and exercise return annually or prn  An After Visit Summary was printed and given to the patient.

## 2014-11-13 LAB — IPS PAP TEST WITH REFLEX TO HPV

## 2014-11-16 NOTE — Progress Notes (Signed)
Reviewed personally.  M. Suzanne Vane Yapp, MD.  

## 2015-11-18 ENCOUNTER — Encounter: Payer: Self-pay | Admitting: Certified Nurse Midwife

## 2015-11-18 ENCOUNTER — Ambulatory Visit (INDEPENDENT_AMBULATORY_CARE_PROVIDER_SITE_OTHER): Payer: 59 | Admitting: Certified Nurse Midwife

## 2015-11-18 VITALS — BP 120/78 | HR 68 | Resp 16 | Ht 66.25 in | Wt 209.0 lb

## 2015-11-18 DIAGNOSIS — Z3041 Encounter for surveillance of contraceptive pills: Secondary | ICD-10-CM | POA: Diagnosis not present

## 2015-11-18 DIAGNOSIS — Z01419 Encounter for gynecological examination (general) (routine) without abnormal findings: Secondary | ICD-10-CM | POA: Diagnosis not present

## 2015-11-18 MED ORDER — NORETHINDRONE 0.35 MG PO TABS: ORAL_TABLET | ORAL | Status: DC

## 2015-11-18 NOTE — Patient Instructions (Signed)

## 2015-11-18 NOTE — Progress Notes (Signed)
50 y.o. G76P0010 Divorced  Caucasian Fe here for annual exam.  Periods none on OCP, working well. Not sexually active. No hot flashes or night sweats. Sees PCP for aex/labs and medication management as needed.  No other health issues today. Took [place] vacation last year and was great.   No LMP recorded. Patient is not currently having periods (Reason: Oral contraceptives).          Sexually active: No.  The current method of family planning is oral progesterone-only contraceptive.    Exercising: No.  exercise Smoker:  no  Health Maintenance: Pap:  11-12-14 neg MMG:  10-08-15 category c density birads 2:neg Colonoscopy:  2016 polyp f/u 40yrs BMD:   2002 TDaP:  2011 Shingles: no Pneumonia: no Hep C and HIV: no Labs: pcp Self breast exam: done occ   reports that she has never smoked. She does not have any smokeless tobacco history on file. She reports that she drinks about 4.8 oz of alcohol per week. She reports that she does not use illicit drugs.  Past Medical History  Diagnosis Date  . Thyroid disease     hypothyroid  . Anxiety   . Arm fracture     Past Surgical History  Procedure Laterality Date  . Appendectomy    . Laparoscopic ovarian cystectomy      Current Outpatient Prescriptions  Medication Sig Dispense Refill  . Ascorbic Acid (VITAMIN C PO) Take by mouth as needed.     . Cholecalciferol (VITAMIN D PO) Take 5,000 Int'l Units by mouth as needed.     . cyclobenzaprine (FLEXERIL) 10 MG tablet as needed.    Marland Kitchen escitalopram (LEXAPRO) 10 MG tablet daily.    . norethindrone (ORTHO MICRONOR) 0.35 MG tablet take 1 tablet by mouth once daily 28 tablet 12  . ALPRAZolam (XANAX) 0.5 MG tablet as needed. Reported on 11/18/2015     No current facility-administered medications for this visit.    Family History  Problem Relation Age of Onset  . Thyroid disease Mother   . Depression Mother   . Stroke Mother     ministroke  . Cancer Father     colon  . Depression Sister   .  Cancer Paternal Uncle     melanoma  . Heart attack Paternal Grandfather     ROS:  Pertinent items are noted in HPI.  Otherwise, a comprehensive ROS was negative.  Exam:   BP 120/78 mmHg  Pulse 68  Resp 16  Ht 5' 6.25" (1.683 m)  Wt 209 lb (94.802 kg)  BMI 33.47 kg/m2 Height: 5' 6.25" (168.3 cm) Ht Readings from Last 3 Encounters:  11/18/15 5' 6.25" (1.683 m)  11/12/14 5' 6.25" (1.683 m)  10/30/13  (1.676 m)    General appearance: alert, cooperative and appears stated age Head: Normocephalic, without obvious abnormality, atraumatic Neck: no adenopathy, supple, symmetrical, trachea midline and thyroid normal to inspection and palpation Lungs: clear to auscultation bilaterally Breasts: normal appearance, no masses or tenderness, No nipple retraction or dimpling, No nipple discharge or bleeding, No axillary or supraclavicular adenopathy Heart: regular rate and rhythm Abdomen: soft, non-tender; no masses,  no organomegaly Extremities: extremities normal, atraumatic, no cyanosis or edema Skin: Skin color, texture, turgor normal. No rashes or lesions Lymph nodes: Cervical, supraclavicular, and axillary nodes normal. No abnormal inguinal nodes palpated Neurologic: Grossly normal   Pelvic: External genitalia:  no lesions              Urethra:  normal  appearing urethra with no masses, tenderness or lesions              Bartholin's and Skene's: normal                 Vagina: normal appearing vagina with normal color and discharge, no lesions              Cervix: normal,non tender, no lesions              Pap taken: No. Bimanual Exam:  Uterus:  normal size, contour, position, consistency, mobility, non-tender              Adnexa: normal adnexa and no mass, fullness, tenderness               Rectovaginal: Confirms               Anus:  normal sphincter tone, no lesions  Chaperone present: yes   A:  Well Woman with normal exam  Contraception POP desires continuance  Anxiety  medication with PCP management    P:   Reviewed health and wellness pertinent to exam  Rx Micronor see order  Continue follow up with PCP as needed  Pap smear as above not taken   counseled on breast self exam, mammography screening, HIV risk factors and prevention, adequate intake of calcium and vitamin D, diet and exercise  return annually or prn  An After Visit Summary was printed and given to the patient.

## 2015-11-20 NOTE — Progress Notes (Signed)
Encounter reviewed Zarin Hagmann, MD   

## 2016-08-02 ENCOUNTER — Ambulatory Visit: Payer: Self-pay | Admitting: Neurology

## 2016-08-22 NOTE — Progress Notes (Addendum)
Subjective:   Julie Goodwin was seen in consultation in the movement disorder clinic at the request of Pearson GrippeJames Kim, MD.  The evaluation is for left hand action tremor.  The records that were made available to me were reviewed.  Tremor started approximately 4 years ago and involves the L hand.  Tremor is most noticeable when she holds something of medium weight (cell phone) but heavier things (pot) is better.   There is  family hx of tremor in her 52 year old mother but it didn't start with her until later in life.  No hx of neuroimaging.  Affected by caffeine:  No. (drinks 2-3 cups coffee in AM Affected by alcohol:  No. (drinks 2 drinks per night) Affected by stress:  Yes.   Affected by fatigue:  No. Spills soup if on spoon:  No. (b/c is R hand dominant) Spills glass of liquid if full:  Yes.   (if it is in L hand) Affects ADL's (tying shoes, brushing teeth, etc):  No.  Current/Previously tried tremor medications: n/a (on xanax but only takes 1 time a month)  Current medications that may exacerbate tremor:  n/a  Outside reports reviewed: historical medical records, lab reports and office notes.  Allergies  Allergen Reactions  . Other Rash    COC's-estrogen "felt not me"    Outpatient Encounter Prescriptions as of 08/24/2016  Medication Sig  . ALPRAZolam (XANAX) 0.5 MG tablet as needed. Reported on 11/18/2015  . Ascorbic Acid (VITAMIN C PO) Take by mouth as needed.   . Cholecalciferol (VITAMIN D PO) Take 5,000 Int'l Units by mouth as needed.   . cyclobenzaprine (FLEXERIL) 10 MG tablet as needed.  Marland Kitchen. escitalopram (LEXAPRO) 10 MG tablet daily.  . norethindrone (ORTHO MICRONOR) 0.35 MG tablet take 1 tablet by mouth once daily   No facility-administered encounter medications on file as of 08/24/2016.     Past Medical History:  Diagnosis Date  . Anxiety   . Arm fracture   . Thyroid disease    no longer needs med    Past Surgical History:  Procedure Laterality Date  . APPENDECTOMY     . LAPAROSCOPIC OVARIAN CYSTECTOMY      Social History   Social History  . Marital status: Divorced    Spouse name: N/A  . Number of children: N/A  . Years of education: N/A   Occupational History  . Not on file.   Social History Main Topics  . Smoking status: Never Smoker  . Smokeless tobacco: Never Used  . Alcohol use 4.8 oz/week    8 Standard drinks or equivalent per week     Comment: 2 drinks/night  . Drug use: No  . Sexual activity: No   Other Topics Concern  . Not on file   Social History Narrative   Lives alone in a one story home.  No children.  Works as a Occupational hygienistcorporate travel agent.  Education: high school.    Family Status  Relation Status  . Mother Alive  . Father Deceased  . Sister Alive  . Paternal Uncle   . Paternal Grandfather     Review of Systems A complete 10 system ROS was obtained and was negative apart from what is mentioned.   Objective:   VITALS:   Vitals:   08/24/16 1042  BP: 110/80  Pulse: 80  Weight: 218 lb 1 oz (98.9 kg)  Height: 5' 6.25" (1.683 m)    Gen:  Appears stated age and in NAD.  HEENT:  Normocephalic, atraumatic. The mucous membranes are moist. The superficial temporal arteries are without ropiness or tenderness. Cardiovascular: Regular rate and rhythm. Lungs: Clear to auscultation bilaterally. Neck: There are no carotid bruits noted bilaterally.  NEUROLOGICAL:  Orientation:  The patient is alert and oriented x 3.  Recent and remote memory are intact.  Attention span and concentration are normal.  Able to name objects and repeat without trouble.  Fund of knowledge is appropriate Cranial nerves: There is good facial symmetry. The pupils are equal round and reactive to light bilaterally. Fundoscopic exam reveals clear disc margins bilaterally. Extraocular muscles are intact and visual fields are full to confrontational testing. Speech is fluent and clear. Soft palate rises symmetrically and there is no tongue deviation.  Hearing is intact to conversational tone. Tone: Tone is good throughout. Sensation: Sensation is intact to light touch and pinprick throughout (facial, trunk, extremities). Vibration is intact at the bilateral big toe. There is no extinction with double simultaneous stimulation. There is no sensory dermatomal level identified. Coordination:  The patient has no dysdiadichokinesia or dysmetria. Motor: Strength is 5/5 in the bilateral upper and lower extremities.  Shoulder shrug is equal bilaterally.  There is no pronator drift.  There are no fasciculations noted. DTR's: Deep tendon reflexes are 2+/4 at the bilateral biceps, triceps, brachioradialis, patella and achilles.  Plantar responses are downgoing bilaterally. Gait and Station: The patient is able to ambulate without difficulty. The patient is able to heel toe walk without any difficulty. The patient is able to ambulate in a tandem fashion. The patient is able to stand in the Romberg position.   MOVEMENT EXAM: Tremor:  There is postural tremor that increases with a weight held in the hand.  She has a minor tremor on the right, and more significant tremor on the left, especially when a weight is held in the hand.  There are occasional nonphysiologic aspects.  Tremor is evident when a glass of water is held in the left hand, although she does not necessarily spill it.  Tremor is evident with Archimedes spirals.      Assessment/Plan:   1.  Tremor.  -This is likely asymmetric essential tremor, with the left being worse than the right.  It is worse when a weight is held in the hand.  There are occasional nonphysiologic aspects.  She and I talked about the fact that essential tremor can be asymmetric in about 30% of cases.  We talked about the role stress can play.  We talked about nature and pathophysiology.  We talked about various treatments.  She really only wants a when necessary medication.  I will try low-dose propranolol, 20 mg as needed.  This  may not be enough, but I want her to try when she is at home before she goes out.  She will let me know how she does with that.  We may need to increase the dosage if she tolerates it.  -Gave her a prescription for a weighted glove for the left hand, half pound.  -Told her that I do not think that the Lexapro is contributing to the tremor.  -will try to get labs from PCP in September.   2.  Follow-up in about 4 months, sooner should new neurologic issues arise.  Greater than 50% of the 60 minute visit spent in counseling.  Addendum: Received lab work from primary care physician which was dated 06/01/2016.  White blood cells were 6.3, hemoglobin 13.3, hematocrit 40.6 and platelets 353.  Sodium was 140, potassium 4.8, chloride 100, CO2 26, BUN 7, creatinine 0.71, AST 15, ALT 13, TSH 3.540  CC:  Pearson GrippeJames Kim, MD

## 2016-08-24 ENCOUNTER — Ambulatory Visit (INDEPENDENT_AMBULATORY_CARE_PROVIDER_SITE_OTHER): Payer: 59 | Admitting: Neurology

## 2016-08-24 ENCOUNTER — Encounter: Payer: Self-pay | Admitting: Neurology

## 2016-08-24 VITALS — BP 110/80 | HR 80 | Ht 66.25 in | Wt 218.1 lb

## 2016-08-24 DIAGNOSIS — G250 Essential tremor: Secondary | ICD-10-CM | POA: Diagnosis not present

## 2016-08-24 MED ORDER — PROPRANOLOL HCL 20 MG PO TABS: 20.0000 mg | ORAL_TABLET | Freq: Every day | ORAL | 2 refills | Status: DC | PRN

## 2016-08-24 MED ORDER — AMBULATORY NON FORMULARY MEDICATION: 0 refills | Status: DC

## 2016-08-24 NOTE — Patient Instructions (Addendum)
Start Propranolol 20 mg as needed.

## 2016-11-23 ENCOUNTER — Ambulatory Visit (INDEPENDENT_AMBULATORY_CARE_PROVIDER_SITE_OTHER): Payer: 59 | Admitting: Certified Nurse Midwife

## 2016-11-23 ENCOUNTER — Encounter: Payer: Self-pay | Admitting: Certified Nurse Midwife

## 2016-11-23 VITALS — BP 118/64 | HR 70 | Resp 16 | Ht 66.0 in | Wt 216.0 lb

## 2016-11-23 DIAGNOSIS — Z124 Encounter for screening for malignant neoplasm of cervix: Secondary | ICD-10-CM

## 2016-11-23 DIAGNOSIS — Z3041 Encounter for surveillance of contraceptive pills: Secondary | ICD-10-CM | POA: Diagnosis not present

## 2016-11-23 DIAGNOSIS — Z01419 Encounter for gynecological examination (general) (routine) without abnormal findings: Secondary | ICD-10-CM

## 2016-11-23 MED ORDER — NORETHINDRONE 0.35 MG PO TABS: ORAL_TABLET | ORAL | 12 refills | Status: DC

## 2016-11-23 NOTE — Progress Notes (Signed)
51 y.o. G17P0010 Divorced  Caucasian Fe here for annual exam. Periods none on POP. Denies night sweats or hot flashes. Recent diagnosis of essential tremor with Inderal Rx given. Has not started medication. Sees Dr. Selena Batten yearly for labs and Lexapro and Xanax management. Not sexually active, no STD concerns of testing needed. No other health issues today.Would like to stay on Micronor for cycle control, history of irregular cycles. Planning vacation !  No LMP recorded. Patient is not currently having periods (Reason: Oral contraceptives).          Sexually active: No.  The current method of family planning is oral progesterone-only contraceptive.    Exercising: No.  exercise Smoker:  no  Health Maintenance: Pap:  11-12-14 neg MMG:  10-08-15 category c density birads 2:neg Colonoscopy:  2016 polyps f/u 61yrs BMD:   2002 TDaP:  2011 Shingles: no Pneumonia: no Hep C and HIV: not done Labs: pcp Self breast exam: not done   reports that she has never smoked. She has never used smokeless tobacco. She reports that she drinks about 3.6 oz of alcohol per week . She reports that she does not use drugs.  Past Medical History:  Diagnosis Date  . Anxiety   . Arm fracture   . Thyroid disease    no longer needs med    Past Surgical History:  Procedure Laterality Date  . APPENDECTOMY    . LAPAROSCOPIC OVARIAN CYSTECTOMY      Current Outpatient Prescriptions  Medication Sig Dispense Refill  . ALPRAZolam (XANAX) 0.5 MG tablet as needed. Reported on 11/18/2015    . cyclobenzaprine (FLEXERIL) 10 MG tablet as needed.    Marland Kitchen escitalopram (LEXAPRO) 10 MG tablet daily.    . norethindrone (ORTHO MICRONOR) 0.35 MG tablet take 1 tablet by mouth once daily 28 tablet 12  . zaleplon (SONATA) 10 MG capsule as needed.     . propranolol (INDERAL) 20 MG tablet Take 1 tablet (20 mg total) by mouth daily as needed. (Patient not taking: Reported on 11/23/2016) 30 tablet 2   No current facility-administered medications  for this visit.     Family History  Problem Relation Age of Onset  . Thyroid disease Mother   . Depression Mother   . Stroke Mother     ministroke  . Cancer Father     colon  . Depression Sister   . Cancer Paternal Uncle     melanoma  . Heart attack Paternal Grandfather     ROS:  Pertinent items are noted in HPI.  Otherwise, a comprehensive ROS was negative.  Exam:   BP 118/64   Pulse 70   Resp 16   Ht 5\' 6"  (1.676 m)   Wt 216 lb (98 kg)   BMI 34.86 kg/m  Height: 5\' 6"  (167.6 cm) Ht Readings from Last 3 Encounters:  11/23/16 5\' 6"  (1.676 m)  08/24/16 5' 6.25" (1.683 m)  11/18/15 5' 6.25" (1.683 m)    General appearance: alert, cooperative and appears stated age Head: Normocephalic, without obvious abnormality, atraumatic Neck: no adenopathy, supple, symmetrical, trachea midline and thyroid normal to inspection and palpation Lungs: clear to auscultation bilaterally Breasts: normal appearance, no masses or tenderness, No nipple retraction or dimpling, No nipple discharge or bleeding, No axillary or supraclavicular adenopathy Heart: regular rate and rhythm Abdomen: soft, non-tender; no masses,  no organomegaly Extremities: extremities normal, atraumatic, no cyanosis or edema Skin: Skin color, texture, turgor normal. No rashes or lesions Lymph nodes: Cervical, supraclavicular, and  axillary nodes normal. No abnormal inguinal nodes palpated Neurologic: Grossly normal   Pelvic: External genitalia:  no lesions              Urethra:  normal appearing urethra with no masses, tenderness or lesions              Bartholin's and Skene's: normal                 Vagina: normal appearing vagina with normal color and discharge, no lesions              Cervix: no bleeding following Pap, no cervical motion tenderness and no lesions              Pap taken: Yes.   Bimanual Exam:  Uterus:  normal size, contour, position, consistency, mobility, non-tender              Adnexa: normal  adnexa and no mass, fullness, tenderness               Rectovaginal: Confirms               Anus:  normal sphincter tone, no lesions  Chaperone present: yes  A:  Well Woman with normal exam  Contraception POP desired for cycle control and contraception  Anxiety/essential tremor diagnosis with MD management    P:   Reviewed health and wellness pertinent to exam  Rx Micronor see order with instructions  Continue follow up with PCP as indicated  Pap smear as above with HPVHR   counseled on breast self exam, mammography screening, STD prevention, HIV risk factors and prevention, use and side effects of OCP's, adequate intake of calcium and vitamin D, diet and exercise  return annually or prn  An After Visit Summary was printed and given to the patient.

## 2016-11-23 NOTE — Progress Notes (Signed)
Encounter reviewed Yasamin Karel, MD   

## 2016-11-23 NOTE — Patient Instructions (Signed)

## 2016-11-28 LAB — IPS PAP TEST WITH HPV

## 2016-11-30 ENCOUNTER — Other Ambulatory Visit: Payer: Self-pay | Admitting: Certified Nurse Midwife

## 2016-11-30 DIAGNOSIS — Z3041 Encounter for surveillance of contraceptive pills: Secondary | ICD-10-CM

## 2016-11-30 NOTE — Telephone Encounter (Signed)
Medication refill request: Norethindrone Last AEX:  11/23/16 DL Next AEX: 4/0/983/6/19 DL Last MMG (if hormonal medication request): 10/08/15 BIRADS2, Density C, [facility] Refill authorized: 11/23/16 #28 12R. Patient never received printed copy of prescription when in the office. Please advise. Thank you.

## 2016-11-30 NOTE — Telephone Encounter (Signed)
Left message for patient to call Julie Goodwin back.  

## 2016-11-30 NOTE — Telephone Encounter (Signed)
If it did not go through and converted over to print, I was not aware. Will refill.

## 2016-11-30 NOTE — Telephone Encounter (Signed)
Patient returned your call.

## 2016-12-21 ENCOUNTER — Ambulatory Visit: Payer: 59 | Admitting: Neurology

## 2017-02-08 ENCOUNTER — Ambulatory Visit: Payer: 59 | Admitting: Neurology

## 2017-04-06 ENCOUNTER — Ambulatory Visit: Payer: 59 | Admitting: Neurology

## 2017-11-29 ENCOUNTER — Ambulatory Visit: Payer: 59 | Admitting: Certified Nurse Midwife

## 2017-12-06 ENCOUNTER — Encounter: Payer: Self-pay | Admitting: Certified Nurse Midwife

## 2017-12-06 ENCOUNTER — Other Ambulatory Visit: Payer: Self-pay

## 2017-12-06 ENCOUNTER — Ambulatory Visit (INDEPENDENT_AMBULATORY_CARE_PROVIDER_SITE_OTHER): Payer: Commercial Managed Care - PPO | Admitting: Certified Nurse Midwife

## 2017-12-06 VITALS — BP 124/84 | HR 72 | Resp 16 | Ht 66.25 in | Wt 213.0 lb

## 2017-12-06 DIAGNOSIS — Z01419 Encounter for gynecological examination (general) (routine) without abnormal findings: Secondary | ICD-10-CM

## 2017-12-06 DIAGNOSIS — Z3041 Encounter for surveillance of contraceptive pills: Secondary | ICD-10-CM

## 2017-12-06 DIAGNOSIS — Z8679 Personal history of other diseases of the circulatory system: Secondary | ICD-10-CM

## 2017-12-06 DIAGNOSIS — N951 Menopausal and female climacteric states: Secondary | ICD-10-CM

## 2017-12-06 NOTE — Patient Instructions (Signed)

## 2017-12-06 NOTE — Progress Notes (Signed)
52 y.o. G30P0010 Divorced  Caucasian Fe here for annual exam. Contraception on POP. Denies vaginal bleeding or vaginal dryness. Was diagnosed with hypertension with PCP and now on medication. Tolerating well. Had labs and cardiology evaluation with no issues. Would like to stop POP if menopausal. Has had no period in past year. Sees PCP for Lexapro and Xanax management. Sees has essential tremor with no change, sees Neurology for follow up. No other health issues today. Planning to return to [place] soon!  No LMP recorded. Patient is not currently having periods (Reason: Oral contraceptives).          Sexually active: No.  The current method of family planning is oral progesterone-only contraceptive.    Exercising: No.  exercise Smoker:  no  Health Maintenance: Pap:  11-12-14 neg, 11-23-16 neg HPV HR neg History of Abnormal Pap: no MMG:  2018 neg per patient, waiting for fax Self Breast exams: yes Colonoscopy:  2016 polyps f/u 1yrs BMD:   2002 TDaP:  2011 Shingles: no Pneumonia: no Hep C and HIV: pt believes she had it done Labs: with PCP   reports that  has never smoked. she has never used smokeless tobacco. She reports that she drinks about 3.0 oz of alcohol per week. She reports that she does not use drugs.  Past Medical History:  Diagnosis Date  . ADD (attention deficit disorder)   . Anxiety   . Arm fracture   . Hypertension   . Thyroid disease    no longer needs med    Past Surgical History:  Procedure Laterality Date  . APPENDECTOMY    . LAPAROSCOPIC OVARIAN CYSTECTOMY      Current Outpatient Medications  Medication Sig Dispense Refill  . ADZENYS XR-ODT 18.8 MG TBED Take 1 tablet by mouth daily.  0  . ALPRAZolam (XANAX) 0.5 MG tablet as needed. Reported on 11/18/2015    . amLODipine (NORVASC) 5 MG tablet TK 1/2 T PO ONCE D  0  . cyclobenzaprine (FLEXERIL) 10 MG tablet as needed.    Marland Kitchen escitalopram (LEXAPRO) 10 MG tablet daily.    Marland Kitchen losartan-hydrochlorothiazide (HYZAAR)  100-25 MG tablet   0  . NORLYDA 0.35 MG tablet take 1 tablet by mouth once daily 28 tablet 12  . OMEPRAZOLE PO Take by mouth as needed.    . zaleplon (SONATA) 10 MG capsule as needed.      No current facility-administered medications for this visit.     Family History  Problem Relation Age of Onset  . Thyroid disease Mother   . Depression Mother   . Stroke Mother        ministroke  . Cancer Father        colon  . Depression Sister   . Cancer Paternal Uncle        melanoma  . Heart attack Paternal Grandfather     ROS:  Pertinent items are noted in HPI.  Otherwise, a comprehensive ROS was negative.  Exam:   BP 124/84   Pulse 72   Resp 16   Ht 5' 6.25" (1.683 m)   Wt 213 lb (96.6 kg)   BMI 34.12 kg/m  Height: 5' 6.25" (168.3 cm) Ht Readings from Last 3 Encounters:  12/06/17 5' 6.25" (1.683 m)  11/23/16 5\' 6"  (1.676 m)  08/24/16 5' 6.25" (1.683 m)    General appearance: alert, cooperative and appears stated age Head: Normocephalic, without obvious abnormality, atraumatic Neck: no adenopathy, supple, symmetrical, trachea midline and thyroid normal to  inspection and palpation Lungs: clear to auscultation bilaterally Breasts: normal appearance, no masses or tenderness, No nipple retraction or dimpling, No nipple discharge or bleeding, No axillary or supraclavicular adenopathy Heart: regular rate and rhythm Abdomen: soft, non-tender; no masses,  no organomegaly Extremities: extremities normal, atraumatic, no cyanosis or edema Skin: Skin color, texture, turgor normal. No rashes or lesions Lymph nodes: Cervical, supraclavicular, and axillary nodes normal. No abnormal inguinal nodes palpated Neurologic: Grossly normal   Pelvic: External genitalia:  no lesions              Urethra:  normal appearing urethra with no masses, tenderness or lesions              Bartholin's and Skene's: normal                 Vagina: normal appearing vagina with normal color and discharge, no  lesions              Cervix: no cervical motion tenderness, no lesions and normal appearance              Pap taken: No. Bimanual Exam:  Uterus:  normal size, contour, position, consistency, mobility, non-tender and anteverted              Adnexa: normal adnexa and no mass, fullness, tenderness               Rectovaginal: Confirms               Anus:  normal sphincter tone, no lesions  Chaperone present: yes  A:  Well Woman with normal exam  Contraception POP desired if needed  Perimenopausal with symptoms  Obese working on weight loss  New diagnosis of Hypertension on medication now  P:   Reviewed health and wellness pertinent to exam  Risks/benefits/warning signs discussed, will continue if needed. No Rx given today  Discussed perimenopausal and expectations  Continue weight loss journey for better health    Continue follow up with  PCP as needed  Pap smear: no   counseled on breast self exam, mammography screening, feminine hygiene, adequate intake of calcium and vitamin D, diet and exercise  return annually or prn  An After Visit Summary was printed and given to the patient.

## 2017-12-07 ENCOUNTER — Telehealth: Payer: Self-pay

## 2017-12-07 LAB — FOLLICLE STIMULATING HORMONE: FSH: 40.3 m[IU]/mL

## 2017-12-07 NOTE — Telephone Encounter (Signed)
[email or web address]

## 2017-12-07 NOTE — Telephone Encounter (Signed)
-----   Message from Verner Choleborah S Leonard, CNM sent at 12/07/2017  7:27 AM EDT ----- [facility]. She may or may not have bleeding again. Since she is not sexually active contraception not a concern, so she can stop her POP. Does need to notify if she has bleeding again, to make sure with in normal parameters.

## 2017-12-07 NOTE — Telephone Encounter (Signed)
Patient notified of results. See lab 

## 2017-12-12 ENCOUNTER — Encounter: Payer: Self-pay | Admitting: Certified Nurse Midwife

## 2018-12-12 ENCOUNTER — Ambulatory Visit: Payer: Commercial Managed Care - PPO | Admitting: Certified Nurse Midwife

## 2019-01-16 ENCOUNTER — Ambulatory Visit: Payer: Commercial Managed Care - PPO | Admitting: Certified Nurse Midwife

## 2019-07-08 DIAGNOSIS — Z0000 Encounter for general adult medical examination without abnormal findings: Secondary | ICD-10-CM | POA: Diagnosis not present

## 2019-07-08 DIAGNOSIS — N390 Urinary tract infection, site not specified: Secondary | ICD-10-CM | POA: Diagnosis not present

## 2019-07-08 DIAGNOSIS — E559 Vitamin D deficiency, unspecified: Secondary | ICD-10-CM | POA: Diagnosis not present

## 2019-07-08 DIAGNOSIS — I10 Essential (primary) hypertension: Secondary | ICD-10-CM | POA: Diagnosis not present

## 2019-07-10 DIAGNOSIS — K219 Gastro-esophageal reflux disease without esophagitis: Secondary | ICD-10-CM | POA: Diagnosis not present

## 2019-07-10 DIAGNOSIS — I10 Essential (primary) hypertension: Secondary | ICD-10-CM | POA: Diagnosis not present

## 2019-07-10 DIAGNOSIS — F418 Other specified anxiety disorders: Secondary | ICD-10-CM | POA: Diagnosis not present

## 2019-07-10 DIAGNOSIS — Z6835 Body mass index (BMI) 35.0-35.9, adult: Secondary | ICD-10-CM | POA: Diagnosis not present

## 2019-07-10 DIAGNOSIS — Z0000 Encounter for general adult medical examination without abnormal findings: Secondary | ICD-10-CM | POA: Diagnosis not present

## 2019-07-15 ENCOUNTER — Encounter: Payer: Self-pay | Admitting: Neurology

## 2019-07-15 ENCOUNTER — Ambulatory Visit: Payer: BC Managed Care – PPO | Admitting: Neurology

## 2019-07-15 ENCOUNTER — Other Ambulatory Visit: Payer: Self-pay

## 2019-07-15 VITALS — BP 132/88 | HR 88 | Temp 98.5°F | Ht 67.0 in | Wt 226.0 lb

## 2019-07-15 DIAGNOSIS — M5412 Radiculopathy, cervical region: Secondary | ICD-10-CM | POA: Diagnosis not present

## 2019-07-15 DIAGNOSIS — G250 Essential tremor: Secondary | ICD-10-CM | POA: Diagnosis not present

## 2019-07-15 DIAGNOSIS — M7918 Myalgia, other site: Secondary | ICD-10-CM | POA: Insufficient documentation

## 2019-07-15 NOTE — Patient Instructions (Signed)
Physical Therapy including dry needling If needed, MRI cervical spine and emg/ncs   Electromyoneurogram Electromyoneurogram is a test to check how well your muscles and nerves are working. This procedure includes the combined use of electromyogram (EMG) and nerve conduction study (NCS). EMG is used to look for muscular disorders. NCS, which is also called electroneurogram, measures how well your nerves are controlling your muscles. The procedures are usually done together to check if your muscles and nerves are healthy. If the results of the tests are abnormal, this may indicate disease or injury, such as a neuromuscular disease or peripheral nerve damage. Tell a health care provider about:  Any allergies you have.  All medicines you are taking, including vitamins, herbs, eye drops, creams, and over-the-counter medicines.  Any problems you or family members have had with anesthetic medicines.  Any blood disorders you have.  Any surgeries you have had.  Any medical conditions you have.  If you have a pacemaker.  Whether you are pregnant or may be pregnant. What are the risks? Generally, this is a safe procedure. However, problems may occur, including:  Infection where the electrodes were inserted.  Bleeding. What happens before the procedure? Medicines Ask your health care provider about:  Changing or stopping your regular medicines. This is especially important if you are taking diabetes medicines or blood thinners.  Taking medicines such as aspirin and ibuprofen. These medicines can thin your blood. Do not take these medicines unless your health care provider tells you to take them.  Taking over-the-counter medicines, vitamins, herbs, and supplements. General instructions  Your health care provider may ask you to avoid: ? Beverages that have caffeine, such as coffee and tea. ? Any products that contain nicotine or tobacco. These products include cigarettes, e-cigarettes,  and chewing tobacco. If you need help quitting, ask your health care provider.  Do not use lotions or creams on the same day that you will be having the procedure. What happens during the procedure? For EMG   Your health care provider will ask you to stay in a position so that he or she can access the muscle that will be studied. You may be standing, sitting, or lying down.  You may be given a medicine that numbs the area (local anesthetic).  A very thin needle that has an electrode will be inserted into your muscle.  Another small electrode will be placed on your skin near the muscle.  Your health care provider will ask you to continue to remain still.  The electrodes will send a signal that tells about the electrical activity of your muscles. You may see this on a monitor or hear it in the room.  After your muscles have been studied at rest, your health care provider will ask you to contract or flex your muscles. The electrodes will send a signal that tells about the electrical activity of your muscles.  Your health care provider will remove the electrodes and the electrode needles when the procedure is finished. The procedure may vary among health care providers and hospitals. For NCS   An electrode that records your nerve activity (recording electrode) will be placed on your skin by the muscle that is being studied.  An electrode that is used as a reference (reference electrode) will be placed near the recording electrode.  A paste or gel will be applied to your skin between the recording electrode and the reference electrode.  Your nerve will be stimulated with a mild shock. Your health  care provider will measure how much time it takes for your muscle to react.  Your health care provider will remove the electrodes and the gel when the procedure is finished. The procedure may vary among health care providers and hospitals. What happens after the procedure?  It is up to you  to get the results of your procedure. Ask your health care provider, or the department that is doing the procedure, when your results will be ready.  Your health care provider may: ? Give you medicines for any pain. ? Monitor the insertion sites to make sure that bleeding stops. Summary  Electromyoneurogram is a test to check how well your muscles and nerves are working.  If the results of the tests are abnormal, this may indicate disease or injury.  This is a safe procedure. However, problems may occur, such as bleeding and infection.  Your health care provider will do two tests to complete this procedure. One checks your muscles (EMG) and another checks your nerves (NCS).  It is up to you to get the results of your procedure. Ask your health care provider, or the department that is doing the procedure, when your results will be ready. This information is not intended to replace advice given to you by your health care provider. Make sure you discuss any questions you have with your health care provider. Document Released: 01/13/2005 Document Revised: 05/29/2018 Document Reviewed: 05/11/2018 Elsevier Patient Education  2020 [employer]  Magnetic Resonance Imaging Magnetic resonance imaging (MRI) is a painless test that takes pictures of the inside of your body. This test uses a strong magnet. This test does not use X-rays. What happens before the procedure?  You will be asked about any metal you may have in your body. This includes: ? Any joint replacement (prosthesis), such as an artificial knee or hip. ? Any implanted devices or ports. ? A metallic ear implant (cochlear implant). ? An artificial heart valve. ? A metallic object in the eye. ? Metal splinters. ? Bullet fragments. ? Any tattoos. Some of the darker inks can cause problems with testing.  You will be asked to take off all metal. This includes: ? Your watch, jewelry, and other metal objects. ? Hearing aids. ?  Dentures. ? Underwire bra. ? Makeup. Some kinds of makeup contain small amounts of metal. ? Braces and fillings normally are not a problem.  If you are breastfeeding, ask your doctor if you need to pump before your test and then stop breastfeeding for a short time. You may need to do this if dye (contrast material) will be used during your MRI.  If you have a fear of cramped spaces (claustrophobia), you may be given medicines prior to the MRI. What happens during the procedure?   You may be given earplugs or headphones to listen to music. The MRI machine can be noisy.  You will lie down on a platform. It looks like a long table.  If dye will be used, an IV tube will be placed into one of your veins. Dye will be given through your IV tube at a certain time as images are taken.  The platform will slide into a tunnel. The tunnel has magnets inside of it. When you are inside the tunnel, you will still be able to talk to your doctor.  The tunnel will scan your body and make images. You will be asked to lie very still. Your doctor will tell you when you can move. You may have  to wait a few minutes to make sure that the images from the test are clear.  When all images are taken, the platform will slide out of the tunnel. The procedure may vary among doctors and hospitals. What happens after the procedure?  You may be taken to a recovery area if sedation medicines were used. Your blood pressure, heart rate, breathing rate, and blood oxygen level will be monitored until you leave the hospital or clinic.  If dye was used: ? It will leave your body through your pee (urine). It takes about a day for all of the dye to leave the body. ? You may be told to drink plenty of fluids. This helps your body get rid of the dye. ? If you are breastfeeding, do not breastfeed your child until your doctor says that this is safe.  You may go back to your normal activities right away, or as told by your doctor.   It is up to you to get your test results. Ask your doctor, or the department that is doing the test, when your results will be ready. Summary  Magnetic resonance imaging (MRI) is a test that takes pictures of the inside of your body.  Before your MRI, be sure to tell your doctor about any metal you may have in your body.  You may go back to your normal activities right away, or as told by your doctor. This information is not intended to replace advice given to you by your health care provider. Make sure you discuss any questions you have with your health care provider. Document Released: 10/15/2010 Document Revised: 08/07/2017 Document Reviewed: 08/07/2017 Elsevier Patient Education  2020 [employer]  Cervical Radiculopathy  Cervical radiculopathy means that a nerve in the neck (a cervical nerve) is pinched or bruised. This can happen because of an injury to the cervical spine (vertebrae) in the neck, or as a normal part of getting older. This can cause pain or loss of feeling (numbness) that runs from your neck all the way down to your arm and fingers. Often, this condition gets better with rest. Treatment may be needed if the condition does not get better. What are the causes?  A neck injury.  A bulging disk in your spine.  Muscle movements that you cannot control (muscle spasms).  Tight muscles in your neck due to overuse.  Arthritis.  Breakdown in the bones and joints of the spine (spondylosis) due to getting older.  Bone spurs that form near the nerves in the neck. What are the signs or symptoms?  Pain. The pain may: ? Run from the neck to the arm and hand. ? Be very bad or irritating. ? Be worse when you move your neck.  Loss of feeling or tingling in your arm or hand.  Weakness in your arm or hand, in very bad cases. How is this treated? In many cases, treatment is not needed for this condition. With rest, the condition often gets better over time. If treatment  is needed, options may include:  Wearing a soft neck collar (cervical collar) for short periods of time, as told by your doctor.  Doing exercises (physical therapy) to strengthen your neck muscles.  Taking medicines.  Having shots (injections) in your spine, in very bad cases.  Having surgery. This may be needed if other treatments do not help. The type of surgery that is used depends on the cause of your condition. Follow these instructions at home: If you have a  soft neck collar:  Wear it as told by your doctor. Remove it only as told by your doctor.  Ask your doctor if you can remove the collar for cleaning and bathing. If you are allowed to remove the collar for cleaning or bathing: ? Follow instructions from your doctor about how to remove the collar safely. ? Clean the collar by wiping it with mild soap and water and drying it completely. ? Take out any removable pads in the collar every 1-2 days. Wash them by hand with soap and water. Let them air-dry completely before you put them back in the collar. ? Check your skin under the collar for redness or sores. If you see any, tell your doctor. Managing pain      Take over-the-counter and prescription medicines only as told by your doctor.  If told, put ice on the painful area. ? If you have a soft neck collar, remove it as told by your doctor. ? Put ice in a plastic bag. ? Place a towel between your skin and the bag. ? Leave the ice on for 20 minutes, 2-3 times a day.  If using ice does not help, you can try using heat. Use the heat source that your doctor recommends, such as a moist heat pack or a heating pad. ? Place a towel between your skin and the heat source. ? Leave the heat on for 20-30 minutes. ? Remove the heat if your skin turns bright red. This is very important if you are unable to feel pain, heat, or cold. You may have a greater risk of getting burned.  You may try a gentle neck and shoulder rub (massage).  Activity  Rest as needed.  Return to your normal activities as told by your doctor. Ask your doctor what activities are safe for you.  Do exercises as told by your doctor or physical therapist.  Do not lift anything that is heavier than 10 lb (4.5 kg) until your doctor tells you that it is safe. General instructions  Use a flat pillow when you sleep.  Do not drive while wearing a soft neck collar. If you do not have a soft neck collar, ask your doctor if it is safe to drive while your neck heals.  Ask your doctor if the medicine prescribed to you requires you to avoid driving or using heavy machinery.  Do not use any products that contain nicotine or tobacco, such as cigarettes, e-cigarettes, and chewing tobacco. These can delay healing. If you need help quitting, ask your doctor.  Keep all follow-up visits as told by your doctor. This is important. Contact a doctor if:  Your condition does not get better with treatment. Get help right away if:  Your pain gets worse and is not helped with medicine.  You lose feeling or feel weak in your hand, arm, face, or leg.  You have a high fever.  You have a stiff neck.  You cannot control when you poop or pee (have incontinence).  You have trouble with walking, balance, or talking. Summary  Cervical radiculopathy means that a nerve in the neck is pinched or bruised.  A nerve can get pinched from a bulging disk, arthritis, an injury to the neck, or other causes.  Symptoms include pain, tingling, or loss of feeling that goes from the neck into the arm or hand.  Weakness in your arm or hand can happen in very bad cases.  Treatment may include resting, wearing a soft  neck collar, and doing exercises. You might need to take medicines for pain. In very bad cases, shots or surgery may be needed. This information is not intended to replace advice given to you by your health care provider. Make sure you discuss any questions you have with  your health care provider. Document Released: 09/01/2011 Document Revised: 08/03/2018 Document Reviewed: 08/03/2018 Elsevier Patient Education  2020 [employer].

## 2019-07-15 NOTE — Progress Notes (Signed)
[facility]    Provider:  Dr Jaynee Eagles Requesting Provider: Jani Gravel, MD Primary Care Provider:  Jani Gravel, MD  CC:  Right arm numbness and tingling  HPI:  Julie Goodwin is a 53 y.o. female here as requested by Jani Gravel, MD for right arm numbness and tingling.  Past medical history of insomnia, muscle spasm, numbness and tingling, TMJ, thyroid nodules, dysthymia, sinus issues, anxiety, hypertension, GERD, ADHD. She has pain in her shoulder (feels tightness in the right trapezius) and numbness and tingling down to her fingertips, worse when sleeping, radiating down her arm, masseuse said he felt a muscle tightness, she has a knot in her shoulder, tingling mostly digits 1-4 of the right hand and uncomfortable feeling. Worse positionally, better with changing positions, she feels muscle spasms down the arm and tingling in the fingers. Happens when sitting a certain way, better changing positions, brief when it does happen if she can move. No weakness in that arm she does not things, she has not noticed weakness of grip in the right hand, on the left she has a tremor. Advil and flexeril helps. She has radiating pain down her shoulder. No other symptoms. Ongoing for several months and improving. No other focal neurologic deficits, associated symptoms, inciting events or modifiable factors.  Reviewed notes, labs and imaging from outside physicians, which showed: I reviewed Dr. Julianne Rice notes.  Patient presented for right arm numbness and tingling for 3 to 4 weeks last seen the beginning of September 2020.  Feels like pins-and-needles in her right arm.  Started mostly at night.  Is more near her elbow and shoulder.  Is moved down to her right hand, only affecting the thumb, index and middle fingers.  She denies injury or trauma.  Numbness and sensation of pins-and-needles is occurring more frequently, including during the day.  She reports symptoms are very distressing and interfere with her  daily life.  Her massage therapist could feel something in her shoulder and every time she presses that area in her shoulder the sensation is reproduced.  Patient denies weakness in the arm or hand, loss of use of arm.  Denies numbness and tingling elsewhere.  For the muscle spasm she was advised to take Aleve daily for the next week, use Voltaren gel as well as icy hot or Biofreeze on her right shoulder area as needed, heating pad 10 minutes at a time 4 times daily.  TSH 2017 3.5 (see scanned labs) normal  I reviewed neurology notes from Dr. Carles Collet in 2017.  Patient was seen for left hand action tremor.  The tremor started approximately 4 years prior in the left hand.  More noticeable when she holds things.  There is a family history of tremor.  Exam showed postural tremor in the left with less tremor on the right, occasional nonphysiologic aspects.  Tremor evident with Archimedes spiral's.  Diagnosed with asymmetric essential tremor left being worse than right.  She was tried on low-dose propranolol 20 mg as needed.  She was also given a prescription for weighted glove for the left hand.  No labs were provided in the request for patient evaluation, I will request labs from Dr. Maudie Mercury.  Review of Systems: Patient complains of symptoms per HPI as well as the following symptoms numbness and tingling. Pertinent negatives and positives per HPI. All others negative.   Social History   Socioeconomic History   Marital status: Divorced    Spouse name: Not on file   Number of  children: Not on file   Years of education: Not on file   Highest education level: High school graduate  Occupational History   Not on file  Social Needs   Financial resource strain: Not on file   Food insecurity    Worry: Not on file    Inability: Not on file   Transportation needs    Medical: Not on file    Non-medical: Not on file  Tobacco Use   Smoking status: Never Smoker   Smokeless tobacco: Never Used    Substance and Sexual Activity   Alcohol use: Yes    Alcohol/week: 5.0 standard drinks    Types: 5 Standard drinks or equivalent per week   Drug use: No   Sexual activity: Never    Partners: Male    Birth control/protection: Pill  Lifestyle   Physical activity    Days per week: Not on file    Minutes per session: Not on file   Stress: Not on file  Relationships   Social connections    Talks on phone: Not on file    Gets together: Not on file    Attends religious service: Not on file    Active member of club or organization: Not on file    Attends meetings of clubs or organizations: Not on file    Relationship status: Not on file   Intimate partner violence    Fear of current or ex partner: Not on file    Emotionally abused: Not on file    Physically abused: Not on file    Forced sexual activity: Not on file  Other Topics Concern   Not on file  Social History Narrative   Lives alone in a one story home.  No children.  Works as a Occupational hygienistcorporate travel agent.  Education: high school.   Right handed   Update 07/15/2019: recently laid off    Caffeine: 1 per day    Family History  Problem Relation Age of Onset   Thyroid disease Mother    Depression Mother    Stroke Mother        ministroke   Cancer Father        colon   Depression Sister    Cancer Paternal Uncle        melanoma   Heart attack Paternal Grandfather     Past Medical History:  Diagnosis Date   ADD (attention deficit disorder)    ADHD    Dr. Elisabeth MostStevenson   Anxiety    Arm fracture    Depression    Dysthymia    Eye hemorrhage, right    due to hypertension; Dr. Salvatore Marvelavid Miller   Fibromyalgia    GERD (gastroesophageal reflux disease)    Hypertension    Multiple thyroid nodules    Thyroid disease    no longer needs med   TMJ (temporomandibular joint disorder)     Patient Active Problem List   Diagnosis Date Noted   Essential tremor 07/15/2019   Cervical myofascial pain  syndrome 07/15/2019    Past Surgical History:  Procedure Laterality Date   APPENDECTOMY     LAPAROSCOPIC OVARIAN CYSTECTOMY      Current Outpatient Medications  Medication Sig Dispense Refill   ADZENYS XR-ODT 18.8 MG TBED Take 1 tablet by mouth daily.  0   ALPRAZolam (XANAX) 0.5 MG tablet as needed. Reported on 11/18/2015     amLODipine (NORVASC) 5 MG tablet TK 1/2 T PO ONCE D  0  cyclobenzaprine (FLEXERIL) 10 MG tablet as needed.     escitalopram (LEXAPRO) 10 MG tablet daily.     losartan-hydrochlorothiazide (HYZAAR) 100-25 MG tablet Take 1 tablet by mouth daily.   0   OMEPRAZOLE PO Take by mouth as needed.     zaleplon (SONATA) 10 MG capsule as needed.      No current facility-administered medications for this visit.     Allergies as of 07/15/2019 - Review Complete 07/15/2019  Allergen Reaction Noted   Other Rash 10/22/2013    Vitals: BP 132/88 (BP Location: Right Arm, Patient Position: Sitting)    Pulse 88    Temp 98.5 F (36.9 C) Comment: taken at front door   Ht 5\' 7"  (1.702 m)    Wt 226 lb (102.5 kg)    BMI 35.40 kg/m  Last Weight:  Wt Readings from Last 1 Encounters:  07/15/19 226 lb (102.5 kg)   Last Height:   Ht Readings from Last 1 Encounters:  07/15/19 5\' 7"  (1.702 m)     Physical exam: Exam: Gen: NAD, conversant, well nourised, obese, well groomed                     CV: RRR, no MRG. No Carotid Bruits. No peripheral edema, warm, nontender Eyes: Conjunctivae clear without exudates or hemorrhage  Neuro: Detailed Neurologic Exam  Speech:    Speech is normal; fluent and spontaneous with normal comprehension.  Cognition:    The patient is oriented to person, place, and time;     recent and remote memory intact;     language fluent;     normal attention, concentration,     fund of knowledge Cranial Nerves:    The pupils are equal, round, and reactive to light. Fundi are flat. Visual fields are full to finger confrontation. Extraocular  movements are intact. Trigeminal sensation is intact and the muscles of mastication are normal. The face is symmetric. The palate elevates in the midline. Hearing intact. Voice is normal. Shoulder shrug is normal. The tongue has normal motion without fasciculations.   Coordination:    No dysmetria or ataxia.   Gait:  normal native gait  Motor Observation:    No asymmetry, no atrophy. Left UE postural and action tremor Tone:    Normal muscle tone.    Posture:    Posture is normal. normal erect    Strength:    Strength is V/V in the upper and lower limbs.      Sensation: intact to LT     Reflex Exam:  DTR's: left biceps hyperreflexic. Otherwise deep tendon reflexes in the upper and lower extremities are normal bilaterally.   Toes:    The toes are equiv bilaterally.   Clonus:    Clonus is absent.    Assessment/Plan:  53 year old with radiating right arm numbness and tingling.   - Will send to physical therapy to include dry needling as clinically warranted by PT - Recommend MRI cervical spine due to asymetric reflexes but she declines at this time - Also emg/ncs if symptoms do not improve - cont flexeril  Orders Placed This Encounter  Procedures   Ambulatory referral to Physical Therapy    Cc: , MD,    51, MD  [facility] [address] [place], [address] [place]  Phone [phone] Fax [phone]

## 2019-07-29 DIAGNOSIS — F418 Other specified anxiety disorders: Secondary | ICD-10-CM | POA: Diagnosis not present

## 2019-08-26 DIAGNOSIS — F418 Other specified anxiety disorders: Secondary | ICD-10-CM | POA: Diagnosis not present

## 2019-08-26 DIAGNOSIS — I10 Essential (primary) hypertension: Secondary | ICD-10-CM | POA: Diagnosis not present

## 2019-08-26 DIAGNOSIS — K219 Gastro-esophageal reflux disease without esophagitis: Secondary | ICD-10-CM | POA: Diagnosis not present

## 2019-08-28 DIAGNOSIS — Z79899 Other long term (current) drug therapy: Secondary | ICD-10-CM | POA: Diagnosis not present

## 2019-08-28 DIAGNOSIS — F902 Attention-deficit hyperactivity disorder, combined type: Secondary | ICD-10-CM | POA: Diagnosis not present

## 2019-08-28 DIAGNOSIS — F338 Other recurrent depressive disorders: Secondary | ICD-10-CM | POA: Diagnosis not present

## 2019-08-28 DIAGNOSIS — F419 Anxiety disorder, unspecified: Secondary | ICD-10-CM | POA: Diagnosis not present

## 2019-11-12 DIAGNOSIS — M25562 Pain in left knee: Secondary | ICD-10-CM | POA: Diagnosis not present

## 2019-11-20 DIAGNOSIS — F419 Anxiety disorder, unspecified: Secondary | ICD-10-CM | POA: Diagnosis not present

## 2019-11-20 DIAGNOSIS — Z79899 Other long term (current) drug therapy: Secondary | ICD-10-CM | POA: Diagnosis not present

## 2019-11-20 DIAGNOSIS — F902 Attention-deficit hyperactivity disorder, combined type: Secondary | ICD-10-CM | POA: Diagnosis not present

## 2019-11-20 DIAGNOSIS — F338 Other recurrent depressive disorders: Secondary | ICD-10-CM | POA: Diagnosis not present

## 2019-11-25 DIAGNOSIS — M25562 Pain in left knee: Secondary | ICD-10-CM | POA: Diagnosis not present

## 2019-11-28 DIAGNOSIS — M25562 Pain in left knee: Secondary | ICD-10-CM | POA: Diagnosis not present

## 2019-12-04 DIAGNOSIS — X58XXXA Exposure to other specified factors, initial encounter: Secondary | ICD-10-CM | POA: Diagnosis not present

## 2019-12-04 DIAGNOSIS — S83232A Complex tear of medial meniscus, current injury, left knee, initial encounter: Secondary | ICD-10-CM | POA: Diagnosis not present

## 2019-12-04 DIAGNOSIS — Y999 Unspecified external cause status: Secondary | ICD-10-CM | POA: Diagnosis not present

## 2019-12-04 DIAGNOSIS — S83242A Other tear of medial meniscus, current injury, left knee, initial encounter: Secondary | ICD-10-CM | POA: Diagnosis not present

## 2019-12-04 DIAGNOSIS — M1712 Unilateral primary osteoarthritis, left knee: Secondary | ICD-10-CM | POA: Diagnosis not present

## 2019-12-09 DIAGNOSIS — M6281 Muscle weakness (generalized): Secondary | ICD-10-CM | POA: Diagnosis not present

## 2019-12-09 DIAGNOSIS — M1712 Unilateral primary osteoarthritis, left knee: Secondary | ICD-10-CM | POA: Diagnosis not present

## 2019-12-09 DIAGNOSIS — R269 Unspecified abnormalities of gait and mobility: Secondary | ICD-10-CM | POA: Diagnosis not present

## 2019-12-09 DIAGNOSIS — M25662 Stiffness of left knee, not elsewhere classified: Secondary | ICD-10-CM | POA: Diagnosis not present

## 2019-12-10 DIAGNOSIS — M1712 Unilateral primary osteoarthritis, left knee: Secondary | ICD-10-CM | POA: Diagnosis not present

## 2019-12-12 DIAGNOSIS — M1712 Unilateral primary osteoarthritis, left knee: Secondary | ICD-10-CM | POA: Diagnosis not present

## 2019-12-12 DIAGNOSIS — R269 Unspecified abnormalities of gait and mobility: Secondary | ICD-10-CM | POA: Diagnosis not present

## 2019-12-12 DIAGNOSIS — M6281 Muscle weakness (generalized): Secondary | ICD-10-CM | POA: Diagnosis not present

## 2019-12-12 DIAGNOSIS — M25662 Stiffness of left knee, not elsewhere classified: Secondary | ICD-10-CM | POA: Diagnosis not present

## 2019-12-18 ENCOUNTER — Encounter: Payer: Self-pay | Admitting: Certified Nurse Midwife

## 2019-12-18 DIAGNOSIS — R269 Unspecified abnormalities of gait and mobility: Secondary | ICD-10-CM | POA: Diagnosis not present

## 2019-12-18 DIAGNOSIS — M1712 Unilateral primary osteoarthritis, left knee: Secondary | ICD-10-CM | POA: Diagnosis not present

## 2019-12-18 DIAGNOSIS — M6281 Muscle weakness (generalized): Secondary | ICD-10-CM | POA: Diagnosis not present

## 2019-12-18 DIAGNOSIS — M25662 Stiffness of left knee, not elsewhere classified: Secondary | ICD-10-CM | POA: Diagnosis not present

## 2019-12-23 DIAGNOSIS — R269 Unspecified abnormalities of gait and mobility: Secondary | ICD-10-CM | POA: Diagnosis not present

## 2019-12-23 DIAGNOSIS — M1712 Unilateral primary osteoarthritis, left knee: Secondary | ICD-10-CM | POA: Diagnosis not present

## 2019-12-23 DIAGNOSIS — M25662 Stiffness of left knee, not elsewhere classified: Secondary | ICD-10-CM | POA: Diagnosis not present

## 2019-12-23 DIAGNOSIS — M6281 Muscle weakness (generalized): Secondary | ICD-10-CM | POA: Diagnosis not present

## 2019-12-31 DIAGNOSIS — M1712 Unilateral primary osteoarthritis, left knee: Secondary | ICD-10-CM | POA: Diagnosis not present

## 2020-02-04 DIAGNOSIS — M1712 Unilateral primary osteoarthritis, left knee: Secondary | ICD-10-CM | POA: Diagnosis not present

## 2020-03-11 DIAGNOSIS — Z79899 Other long term (current) drug therapy: Secondary | ICD-10-CM | POA: Diagnosis not present

## 2020-03-11 DIAGNOSIS — F902 Attention-deficit hyperactivity disorder, combined type: Secondary | ICD-10-CM | POA: Diagnosis not present

## 2020-03-11 DIAGNOSIS — F419 Anxiety disorder, unspecified: Secondary | ICD-10-CM | POA: Diagnosis not present

## 2020-04-07 DIAGNOSIS — M25561 Pain in right knee: Secondary | ICD-10-CM | POA: Diagnosis not present

## 2020-04-07 DIAGNOSIS — M1712 Unilateral primary osteoarthritis, left knee: Secondary | ICD-10-CM | POA: Diagnosis not present

## 2020-05-12 DIAGNOSIS — M1712 Unilateral primary osteoarthritis, left knee: Secondary | ICD-10-CM | POA: Diagnosis not present

## 2020-05-19 DIAGNOSIS — M1712 Unilateral primary osteoarthritis, left knee: Secondary | ICD-10-CM | POA: Diagnosis not present

## 2020-05-26 DIAGNOSIS — M1712 Unilateral primary osteoarthritis, left knee: Secondary | ICD-10-CM | POA: Diagnosis not present

## 2020-06-23 DIAGNOSIS — M1711 Unilateral primary osteoarthritis, right knee: Secondary | ICD-10-CM | POA: Diagnosis not present

## 2020-06-27 DIAGNOSIS — M25561 Pain in right knee: Secondary | ICD-10-CM | POA: Diagnosis not present

## 2020-06-30 DIAGNOSIS — M25561 Pain in right knee: Secondary | ICD-10-CM | POA: Diagnosis not present

## 2020-06-30 DIAGNOSIS — F419 Anxiety disorder, unspecified: Secondary | ICD-10-CM | POA: Diagnosis not present

## 2020-06-30 DIAGNOSIS — F902 Attention-deficit hyperactivity disorder, combined type: Secondary | ICD-10-CM | POA: Diagnosis not present

## 2020-06-30 DIAGNOSIS — Z79899 Other long term (current) drug therapy: Secondary | ICD-10-CM | POA: Diagnosis not present

## 2020-07-08 DIAGNOSIS — S83241A Other tear of medial meniscus, current injury, right knee, initial encounter: Secondary | ICD-10-CM | POA: Diagnosis not present

## 2020-07-08 DIAGNOSIS — S83231A Complex tear of medial meniscus, current injury, right knee, initial encounter: Secondary | ICD-10-CM | POA: Diagnosis not present

## 2020-07-08 DIAGNOSIS — G8918 Other acute postprocedural pain: Secondary | ICD-10-CM | POA: Diagnosis not present

## 2020-07-08 DIAGNOSIS — Y999 Unspecified external cause status: Secondary | ICD-10-CM | POA: Diagnosis not present

## 2020-07-08 DIAGNOSIS — M94261 Chondromalacia, right knee: Secondary | ICD-10-CM | POA: Diagnosis not present

## 2020-07-08 DIAGNOSIS — S83271A Complex tear of lateral meniscus, current injury, right knee, initial encounter: Secondary | ICD-10-CM | POA: Diagnosis not present

## 2020-07-08 DIAGNOSIS — M170 Bilateral primary osteoarthritis of knee: Secondary | ICD-10-CM | POA: Diagnosis not present

## 2020-07-08 DIAGNOSIS — X58XXXA Exposure to other specified factors, initial encounter: Secondary | ICD-10-CM | POA: Diagnosis not present

## 2020-07-08 DIAGNOSIS — S83281A Other tear of lateral meniscus, current injury, right knee, initial encounter: Secondary | ICD-10-CM | POA: Diagnosis not present

## 2020-07-13 DIAGNOSIS — R269 Unspecified abnormalities of gait and mobility: Secondary | ICD-10-CM | POA: Diagnosis not present

## 2020-07-13 DIAGNOSIS — S83241D Other tear of medial meniscus, current injury, right knee, subsequent encounter: Secondary | ICD-10-CM | POA: Diagnosis not present

## 2020-07-13 DIAGNOSIS — M6281 Muscle weakness (generalized): Secondary | ICD-10-CM | POA: Diagnosis not present

## 2020-07-13 DIAGNOSIS — M25561 Pain in right knee: Secondary | ICD-10-CM | POA: Diagnosis not present

## 2020-07-15 DIAGNOSIS — Z0000 Encounter for general adult medical examination without abnormal findings: Secondary | ICD-10-CM | POA: Diagnosis not present

## 2020-07-15 DIAGNOSIS — E7800 Pure hypercholesterolemia, unspecified: Secondary | ICD-10-CM | POA: Diagnosis not present

## 2020-07-15 DIAGNOSIS — N390 Urinary tract infection, site not specified: Secondary | ICD-10-CM | POA: Diagnosis not present

## 2020-07-15 DIAGNOSIS — E559 Vitamin D deficiency, unspecified: Secondary | ICD-10-CM | POA: Diagnosis not present

## 2020-07-15 DIAGNOSIS — I10 Essential (primary) hypertension: Secondary | ICD-10-CM | POA: Diagnosis not present

## 2020-07-16 DIAGNOSIS — M6281 Muscle weakness (generalized): Secondary | ICD-10-CM | POA: Diagnosis not present

## 2020-07-16 DIAGNOSIS — M25661 Stiffness of right knee, not elsewhere classified: Secondary | ICD-10-CM | POA: Diagnosis not present

## 2020-07-16 DIAGNOSIS — S83281D Other tear of lateral meniscus, current injury, right knee, subsequent encounter: Secondary | ICD-10-CM | POA: Diagnosis not present

## 2020-07-16 DIAGNOSIS — R269 Unspecified abnormalities of gait and mobility: Secondary | ICD-10-CM | POA: Diagnosis not present

## 2020-07-16 DIAGNOSIS — S83241D Other tear of medial meniscus, current injury, right knee, subsequent encounter: Secondary | ICD-10-CM | POA: Diagnosis not present

## 2020-07-22 DIAGNOSIS — Z6839 Body mass index (BMI) 39.0-39.9, adult: Secondary | ICD-10-CM | POA: Diagnosis not present

## 2020-07-22 DIAGNOSIS — M6281 Muscle weakness (generalized): Secondary | ICD-10-CM | POA: Diagnosis not present

## 2020-07-22 DIAGNOSIS — I10 Essential (primary) hypertension: Secondary | ICD-10-CM | POA: Diagnosis not present

## 2020-07-22 DIAGNOSIS — G4700 Insomnia, unspecified: Secondary | ICD-10-CM | POA: Diagnosis not present

## 2020-07-22 DIAGNOSIS — E661 Drug-induced obesity: Secondary | ICD-10-CM | POA: Diagnosis not present

## 2020-07-22 DIAGNOSIS — M25661 Stiffness of right knee, not elsewhere classified: Secondary | ICD-10-CM | POA: Diagnosis not present

## 2020-07-22 DIAGNOSIS — E559 Vitamin D deficiency, unspecified: Secondary | ICD-10-CM | POA: Diagnosis not present

## 2020-07-22 DIAGNOSIS — Z0000 Encounter for general adult medical examination without abnormal findings: Secondary | ICD-10-CM | POA: Diagnosis not present

## 2020-07-22 DIAGNOSIS — R269 Unspecified abnormalities of gait and mobility: Secondary | ICD-10-CM | POA: Diagnosis not present

## 2020-07-22 DIAGNOSIS — F418 Other specified anxiety disorders: Secondary | ICD-10-CM | POA: Diagnosis not present

## 2020-07-22 DIAGNOSIS — M25561 Pain in right knee: Secondary | ICD-10-CM | POA: Diagnosis not present

## 2020-07-27 DIAGNOSIS — M6281 Muscle weakness (generalized): Secondary | ICD-10-CM | POA: Diagnosis not present

## 2020-07-27 DIAGNOSIS — M25561 Pain in right knee: Secondary | ICD-10-CM | POA: Diagnosis not present

## 2020-07-27 DIAGNOSIS — M25661 Stiffness of right knee, not elsewhere classified: Secondary | ICD-10-CM | POA: Diagnosis not present

## 2020-07-27 DIAGNOSIS — R269 Unspecified abnormalities of gait and mobility: Secondary | ICD-10-CM | POA: Diagnosis not present

## 2020-07-29 DIAGNOSIS — M6281 Muscle weakness (generalized): Secondary | ICD-10-CM | POA: Diagnosis not present

## 2020-07-29 DIAGNOSIS — R262 Difficulty in walking, not elsewhere classified: Secondary | ICD-10-CM | POA: Diagnosis not present

## 2020-07-29 DIAGNOSIS — M25661 Stiffness of right knee, not elsewhere classified: Secondary | ICD-10-CM | POA: Diagnosis not present

## 2020-07-29 DIAGNOSIS — M25561 Pain in right knee: Secondary | ICD-10-CM | POA: Diagnosis not present

## 2020-08-04 DIAGNOSIS — R269 Unspecified abnormalities of gait and mobility: Secondary | ICD-10-CM | POA: Diagnosis not present

## 2020-08-04 DIAGNOSIS — M6281 Muscle weakness (generalized): Secondary | ICD-10-CM | POA: Diagnosis not present

## 2020-08-04 DIAGNOSIS — M25661 Stiffness of right knee, not elsewhere classified: Secondary | ICD-10-CM | POA: Diagnosis not present

## 2020-08-04 DIAGNOSIS — M25561 Pain in right knee: Secondary | ICD-10-CM | POA: Diagnosis not present

## 2020-08-07 DIAGNOSIS — M25561 Pain in right knee: Secondary | ICD-10-CM | POA: Diagnosis not present

## 2020-08-07 DIAGNOSIS — M6281 Muscle weakness (generalized): Secondary | ICD-10-CM | POA: Diagnosis not present

## 2020-08-07 DIAGNOSIS — S83241D Other tear of medial meniscus, current injury, right knee, subsequent encounter: Secondary | ICD-10-CM | POA: Diagnosis not present

## 2020-08-07 DIAGNOSIS — R269 Unspecified abnormalities of gait and mobility: Secondary | ICD-10-CM | POA: Diagnosis not present

## 2020-08-10 DIAGNOSIS — M25661 Stiffness of right knee, not elsewhere classified: Secondary | ICD-10-CM | POA: Diagnosis not present

## 2020-08-10 DIAGNOSIS — R269 Unspecified abnormalities of gait and mobility: Secondary | ICD-10-CM | POA: Diagnosis not present

## 2020-08-10 DIAGNOSIS — M6281 Muscle weakness (generalized): Secondary | ICD-10-CM | POA: Diagnosis not present

## 2020-08-10 DIAGNOSIS — S83241D Other tear of medial meniscus, current injury, right knee, subsequent encounter: Secondary | ICD-10-CM | POA: Diagnosis not present

## 2020-08-12 DIAGNOSIS — S83241D Other tear of medial meniscus, current injury, right knee, subsequent encounter: Secondary | ICD-10-CM | POA: Diagnosis not present

## 2020-08-12 DIAGNOSIS — M25561 Pain in right knee: Secondary | ICD-10-CM | POA: Diagnosis not present

## 2020-08-12 DIAGNOSIS — M6281 Muscle weakness (generalized): Secondary | ICD-10-CM | POA: Diagnosis not present

## 2020-08-12 DIAGNOSIS — R269 Unspecified abnormalities of gait and mobility: Secondary | ICD-10-CM | POA: Diagnosis not present

## 2020-08-19 DIAGNOSIS — R269 Unspecified abnormalities of gait and mobility: Secondary | ICD-10-CM | POA: Diagnosis not present

## 2020-08-19 DIAGNOSIS — M25661 Stiffness of right knee, not elsewhere classified: Secondary | ICD-10-CM | POA: Diagnosis not present

## 2020-08-19 DIAGNOSIS — S83241D Other tear of medial meniscus, current injury, right knee, subsequent encounter: Secondary | ICD-10-CM | POA: Diagnosis not present

## 2020-08-19 DIAGNOSIS — M6281 Muscle weakness (generalized): Secondary | ICD-10-CM | POA: Diagnosis not present

## 2020-08-26 DIAGNOSIS — M6281 Muscle weakness (generalized): Secondary | ICD-10-CM | POA: Diagnosis not present

## 2020-08-26 DIAGNOSIS — M25661 Stiffness of right knee, not elsewhere classified: Secondary | ICD-10-CM | POA: Diagnosis not present

## 2020-08-26 DIAGNOSIS — S83241D Other tear of medial meniscus, current injury, right knee, subsequent encounter: Secondary | ICD-10-CM | POA: Diagnosis not present

## 2020-08-26 DIAGNOSIS — R269 Unspecified abnormalities of gait and mobility: Secondary | ICD-10-CM | POA: Diagnosis not present

## 2020-08-27 DIAGNOSIS — M25561 Pain in right knee: Secondary | ICD-10-CM | POA: Diagnosis not present

## 2020-08-31 DIAGNOSIS — M6281 Muscle weakness (generalized): Secondary | ICD-10-CM | POA: Diagnosis not present

## 2020-08-31 DIAGNOSIS — M25561 Pain in right knee: Secondary | ICD-10-CM | POA: Diagnosis not present

## 2020-08-31 DIAGNOSIS — Z1283 Encounter for screening for malignant neoplasm of skin: Secondary | ICD-10-CM | POA: Diagnosis not present

## 2020-08-31 DIAGNOSIS — D234 Other benign neoplasm of skin of scalp and neck: Secondary | ICD-10-CM | POA: Diagnosis not present

## 2020-08-31 DIAGNOSIS — L821 Other seborrheic keratosis: Secondary | ICD-10-CM | POA: Diagnosis not present

## 2020-08-31 DIAGNOSIS — I781 Nevus, non-neoplastic: Secondary | ICD-10-CM | POA: Diagnosis not present

## 2020-08-31 DIAGNOSIS — M25661 Stiffness of right knee, not elsewhere classified: Secondary | ICD-10-CM | POA: Diagnosis not present

## 2020-08-31 DIAGNOSIS — R269 Unspecified abnormalities of gait and mobility: Secondary | ICD-10-CM | POA: Diagnosis not present

## 2020-09-02 DIAGNOSIS — M6281 Muscle weakness (generalized): Secondary | ICD-10-CM | POA: Diagnosis not present

## 2020-09-02 DIAGNOSIS — M25661 Stiffness of right knee, not elsewhere classified: Secondary | ICD-10-CM | POA: Diagnosis not present

## 2020-09-02 DIAGNOSIS — R269 Unspecified abnormalities of gait and mobility: Secondary | ICD-10-CM | POA: Diagnosis not present

## 2020-09-02 DIAGNOSIS — M25561 Pain in right knee: Secondary | ICD-10-CM | POA: Diagnosis not present

## 2020-09-30 DIAGNOSIS — Z79899 Other long term (current) drug therapy: Secondary | ICD-10-CM | POA: Diagnosis not present

## 2020-09-30 DIAGNOSIS — F338 Other recurrent depressive disorders: Secondary | ICD-10-CM | POA: Diagnosis not present

## 2020-09-30 DIAGNOSIS — F419 Anxiety disorder, unspecified: Secondary | ICD-10-CM | POA: Diagnosis not present

## 2020-09-30 DIAGNOSIS — F902 Attention-deficit hyperactivity disorder, combined type: Secondary | ICD-10-CM | POA: Diagnosis not present

## 2020-11-10 DIAGNOSIS — M25561 Pain in right knee: Secondary | ICD-10-CM | POA: Diagnosis not present

## 2020-11-19 DIAGNOSIS — M1711 Unilateral primary osteoarthritis, right knee: Secondary | ICD-10-CM | POA: Diagnosis not present

## 2020-11-26 DIAGNOSIS — M1711 Unilateral primary osteoarthritis, right knee: Secondary | ICD-10-CM | POA: Diagnosis not present

## 2020-12-03 DIAGNOSIS — M170 Bilateral primary osteoarthritis of knee: Secondary | ICD-10-CM | POA: Diagnosis not present

## 2020-12-10 DIAGNOSIS — M1711 Unilateral primary osteoarthritis, right knee: Secondary | ICD-10-CM | POA: Diagnosis not present

## 2020-12-17 DIAGNOSIS — M1711 Unilateral primary osteoarthritis, right knee: Secondary | ICD-10-CM | POA: Diagnosis not present

## 2020-12-30 DIAGNOSIS — F902 Attention-deficit hyperactivity disorder, combined type: Secondary | ICD-10-CM | POA: Diagnosis not present

## 2020-12-30 DIAGNOSIS — F419 Anxiety disorder, unspecified: Secondary | ICD-10-CM | POA: Diagnosis not present

## 2020-12-30 DIAGNOSIS — Z79899 Other long term (current) drug therapy: Secondary | ICD-10-CM | POA: Diagnosis not present

## 2022-10-05 ENCOUNTER — Other Ambulatory Visit (HOSPITAL_COMMUNITY): Payer: Self-pay | Admitting: Registered Nurse

## 2022-10-05 DIAGNOSIS — I10 Essential (primary) hypertension: Secondary | ICD-10-CM

## 2022-10-27 ENCOUNTER — Ambulatory Visit (HOSPITAL_COMMUNITY)
Admission: RE | Admit: 2022-10-27 | Discharge: 2022-10-27 | Disposition: A | Discharge status: Home / Self Care | Payer: Self-pay | Source: Ambulatory Visit | Attending: Registered Nurse | Admitting: Registered Nurse

## 2022-10-27 DIAGNOSIS — I10 Essential (primary) hypertension: Secondary | ICD-10-CM | POA: Insufficient documentation
